# Patient Record
Sex: Female | Born: 1980 | Race: White | Hispanic: No | Marital: Single | Smoking: Never smoker
Health system: Southern US, Community
[De-identification: ages and names within clinical notes are randomized; demographics above are authoritative.]

## PROBLEM LIST (undated history)

## (undated) DIAGNOSIS — G911 Obstructive hydrocephalus: Secondary | ICD-10-CM

## (undated) DIAGNOSIS — R42 Dizziness and giddiness: Secondary | ICD-10-CM

## (undated) DIAGNOSIS — R000 Tachycardia, unspecified: Secondary | ICD-10-CM

## (undated) DIAGNOSIS — N200 Calculus of kidney: Secondary | ICD-10-CM

## (undated) DIAGNOSIS — R202 Paresthesia of skin: Secondary | ICD-10-CM

## (undated) DIAGNOSIS — F32A Depression, unspecified: Secondary | ICD-10-CM

## (undated) DIAGNOSIS — F3289 Other specified depressive episodes: Secondary | ICD-10-CM

## (undated) DIAGNOSIS — F3181 Bipolar II disorder: Secondary | ICD-10-CM

## (undated) DIAGNOSIS — F329 Major depressive disorder, single episode, unspecified: Secondary | ICD-10-CM

## (undated) HISTORY — DX: Major depressive disorder, single episode, unspecified: F32.9

## (undated) HISTORY — DX: Calculus of kidney: N20.0

## (undated) HISTORY — DX: Other specified depressive episodes: F32.89

## (undated) HISTORY — DX: Paresthesia of skin: R20.2

## (undated) HISTORY — DX: Dizziness and giddiness: R42

## (undated) HISTORY — DX: Bipolar II disorder: F31.81

## (undated) HISTORY — DX: Tachycardia, unspecified: R00.0

## (undated) HISTORY — DX: Obstructive hydrocephalus: G91.1

## (undated) HISTORY — DX: Depression, unspecified: F32.A

---
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
## 2004-07-29 HISTORY — PX: TONSILLECTOMY AND ADENOIDECTOMY: SHX28

## 2004-10-15 ENCOUNTER — Observation Stay (HOSPITAL_COMMUNITY): Admission: EM | Admit: 2004-10-15 | Discharge: 2004-10-16 | Payer: Self-pay | Admitting: Emergency Medicine

## 2004-11-16 ENCOUNTER — Other Ambulatory Visit: Admission: RE | Admit: 2004-11-16 | Discharge: 2004-11-16 | Payer: Self-pay | Admitting: Obstetrics and Gynecology

## 2005-05-22 ENCOUNTER — Ambulatory Visit: Payer: Self-pay | Admitting: Internal Medicine

## 2005-07-15 ENCOUNTER — Ambulatory Visit: Payer: Self-pay | Admitting: Internal Medicine

## 2005-08-23 ENCOUNTER — Ambulatory Visit: Payer: Self-pay | Admitting: Internal Medicine

## 2005-09-03 ENCOUNTER — Ambulatory Visit: Payer: Self-pay | Admitting: Internal Medicine

## 2005-09-10 ENCOUNTER — Ambulatory Visit: Payer: Self-pay | Admitting: Internal Medicine

## 2005-09-16 ENCOUNTER — Ambulatory Visit: Payer: Self-pay | Admitting: Internal Medicine

## 2005-09-20 ENCOUNTER — Ambulatory Visit: Payer: Self-pay | Admitting: Pulmonary Disease

## 2006-08-18 ENCOUNTER — Ambulatory Visit: Payer: Self-pay | Admitting: Internal Medicine

## 2006-08-18 LAB — CONVERTED CEMR LAB
Alkaline Phosphatase: 35 units/L — ABNORMAL LOW (ref 39–117)
Basophils Absolute: 0 10*3/uL (ref 0.0–0.1)
Basophils Relative: 0.2 % (ref 0.0–1.0)
Bilirubin, Direct: 0.1 mg/dL (ref 0.0–0.3)
Eosinophils Relative: 0.7 % (ref 0.0–5.0)
HCT: 42.2 % (ref 36.0–46.0)
Lymphocytes Relative: 30.6 % (ref 12.0–46.0)
MCV: 90.7 fL (ref 78.0–100.0)
Monocytes Absolute: 0.3 10*3/uL (ref 0.2–0.7)
Neutrophils Relative %: 60.9 % (ref 43.0–77.0)
RDW: 12.3 % (ref 11.5–14.6)
Total Protein: 6.7 g/dL (ref 6.0–8.3)
WBC: 4.3 10*3/uL — ABNORMAL LOW (ref 4.5–10.5)

## 2006-09-09 ENCOUNTER — Ambulatory Visit: Payer: Self-pay | Admitting: Internal Medicine

## 2006-09-17 ENCOUNTER — Ambulatory Visit: Payer: Self-pay | Admitting: Internal Medicine

## 2006-10-07 ENCOUNTER — Ambulatory Visit: Payer: Self-pay | Admitting: Internal Medicine

## 2006-10-30 ENCOUNTER — Ambulatory Visit: Payer: Self-pay | Admitting: Internal Medicine

## 2006-10-30 ENCOUNTER — Encounter: Payer: Self-pay | Admitting: Internal Medicine

## 2006-11-17 DIAGNOSIS — F3130 Bipolar disorder, current episode depressed, mild or moderate severity, unspecified: Secondary | ICD-10-CM | POA: Insufficient documentation

## 2007-06-01 ENCOUNTER — Ambulatory Visit: Payer: Self-pay | Admitting: Internal Medicine

## 2007-06-01 ENCOUNTER — Telehealth (INDEPENDENT_AMBULATORY_CARE_PROVIDER_SITE_OTHER): Payer: Self-pay | Admitting: *Deleted

## 2007-09-23 ENCOUNTER — Telehealth (INDEPENDENT_AMBULATORY_CARE_PROVIDER_SITE_OTHER): Payer: Self-pay | Admitting: *Deleted

## 2007-09-23 ENCOUNTER — Ambulatory Visit: Payer: Self-pay | Admitting: Internal Medicine

## 2007-09-23 LAB — CONVERTED CEMR LAB
Bilirubin Urine: NEGATIVE
Ketones, urine, test strip: NEGATIVE
Nitrite: NEGATIVE

## 2007-09-24 ENCOUNTER — Encounter: Payer: Self-pay | Admitting: Internal Medicine

## 2007-09-24 LAB — CONVERTED CEMR LAB: RBC / HPF: NONE SEEN (ref <3)

## 2007-09-29 ENCOUNTER — Telehealth (INDEPENDENT_AMBULATORY_CARE_PROVIDER_SITE_OTHER): Payer: Self-pay | Admitting: *Deleted

## 2007-10-02 ENCOUNTER — Emergency Department (HOSPITAL_COMMUNITY): Admission: EM | Admit: 2007-10-02 | Discharge: 2007-10-02 | Payer: Self-pay | Admitting: Emergency Medicine

## 2007-10-02 ENCOUNTER — Telehealth (INDEPENDENT_AMBULATORY_CARE_PROVIDER_SITE_OTHER): Payer: Self-pay | Admitting: *Deleted

## 2007-10-13 ENCOUNTER — Telehealth (INDEPENDENT_AMBULATORY_CARE_PROVIDER_SITE_OTHER): Payer: Self-pay | Admitting: *Deleted

## 2007-10-13 ENCOUNTER — Ambulatory Visit: Payer: Self-pay | Admitting: Internal Medicine

## 2007-10-13 DIAGNOSIS — N209 Urinary calculus, unspecified: Secondary | ICD-10-CM | POA: Insufficient documentation

## 2007-10-13 LAB — CONVERTED CEMR LAB
Bilirubin Urine: NEGATIVE
Ketones, urine, test strip: NEGATIVE
Specific Gravity, Urine: 1.01
Urobilinogen, UA: NEGATIVE
pH: 6

## 2007-10-14 ENCOUNTER — Encounter: Payer: Self-pay | Admitting: Internal Medicine

## 2007-10-16 ENCOUNTER — Telehealth: Payer: Self-pay | Admitting: Internal Medicine

## 2007-10-16 ENCOUNTER — Ambulatory Visit: Payer: Self-pay | Admitting: Cardiovascular Disease

## 2007-10-16 LAB — CONVERTED CEMR LAB

## 2009-03-16 ENCOUNTER — Encounter: Payer: Self-pay | Admitting: Internal Medicine

## 2009-03-24 ENCOUNTER — Telehealth (INDEPENDENT_AMBULATORY_CARE_PROVIDER_SITE_OTHER): Payer: Self-pay | Admitting: *Deleted

## 2009-04-17 ENCOUNTER — Ambulatory Visit: Payer: Self-pay | Admitting: Internal Medicine

## 2009-04-18 ENCOUNTER — Ambulatory Visit: Payer: Self-pay | Admitting: Internal Medicine

## 2009-04-18 LAB — CONVERTED CEMR LAB
Bilirubin Urine: NEGATIVE
Blood in Urine, dipstick: NEGATIVE
Glucose, Urine, Semiquant: NEGATIVE
Nitrite: NEGATIVE
Protein, U semiquant: NEGATIVE
Specific Gravity, Urine: 1.025
Urobilinogen, UA: 0.2
WBC Urine, dipstick: NEGATIVE
pH: 6

## 2009-04-24 ENCOUNTER — Ambulatory Visit: Payer: Self-pay | Admitting: Internal Medicine

## 2009-04-24 LAB — CONVERTED CEMR LAB
ALT: 11 units/L (ref 0–35)
AST: 13 units/L (ref 0–37)
Basophils Absolute: 0 10*3/uL (ref 0.0–0.1)
Basophils Relative: 0.1 % (ref 0.0–3.0)
Calcium: 9.1 mg/dL (ref 8.4–10.5)
Chloride: 105 meq/L (ref 96–112)
Cholesterol: 123 mg/dL (ref 0–200)
Creatinine, Ser: 0.9 mg/dL (ref 0.4–1.2)
GFR calc non Af Amer: 78.92 mL/min (ref >60)
Glucose, Bld: 85 mg/dL (ref 70–99)
Hemoglobin: 14.9 g/dL (ref 12.0–15.0)
Monocytes Absolute: 0.5 10*3/uL (ref 0.1–1.0)
RDW: 12.5 % (ref 11.5–14.6)
TSH: 2.51 microintl units/mL (ref 0.35–5.50)
Vitamin B-12: 240 pg/mL (ref 211–911)

## 2009-10-04 ENCOUNTER — Encounter: Payer: Self-pay | Admitting: Internal Medicine

## 2009-10-30 ENCOUNTER — Ambulatory Visit: Payer: Self-pay | Admitting: Internal Medicine

## 2009-10-30 DIAGNOSIS — E559 Vitamin D deficiency, unspecified: Secondary | ICD-10-CM | POA: Insufficient documentation

## 2009-10-30 DIAGNOSIS — D696 Thrombocytopenia, unspecified: Secondary | ICD-10-CM | POA: Insufficient documentation

## 2009-10-30 DIAGNOSIS — I10 Essential (primary) hypertension: Secondary | ICD-10-CM | POA: Insufficient documentation

## 2009-10-30 LAB — CONVERTED CEMR LAB: Vit D, 25-Hydroxy: 21 ng/mL — ABNORMAL LOW (ref 30–89)

## 2009-11-02 LAB — CONVERTED CEMR LAB
Basophils Absolute: 0 10*3/uL (ref 0.0–0.1)
Basophils Relative: 0.1 % (ref 0.0–3.0)
HCT: 41.7 % (ref 36.0–46.0)
Hemoglobin: 14.1 g/dL (ref 12.0–15.0)
Lymphocytes Relative: 18.5 % (ref 12.0–46.0)
Lymphs Abs: 1.5 10*3/uL (ref 0.7–4.0)
MCV: 92.9 fL (ref 78.0–100.0)
Monocytes Absolute: 0 10*3/uL — ABNORMAL LOW (ref 0.1–1.0)
RBC: 4.49 M/uL (ref 3.87–5.11)
RDW: 12.6 % (ref 11.5–14.6)
WBC: 7.9 10*3/uL (ref 4.5–10.5)

## 2010-03-14 ENCOUNTER — Ambulatory Visit: Payer: Self-pay | Admitting: Internal Medicine

## 2010-03-14 DIAGNOSIS — R42 Dizziness and giddiness: Secondary | ICD-10-CM | POA: Insufficient documentation

## 2010-03-16 ENCOUNTER — Telehealth: Payer: Self-pay | Admitting: Internal Medicine

## 2010-03-16 LAB — CONVERTED CEMR LAB
BUN: 12 mg/dL (ref 6–23)
Chloride: 106 meq/L (ref 96–112)
Creatinine, Ser: 0.8 mg/dL (ref 0.4–1.2)
Eosinophils Absolute: 0 10*3/uL (ref 0.0–0.7)
GFR calc non Af Amer: 93.89 mL/min (ref >60)
HCT: 43.2 % (ref 36.0–46.0)
Lymphocytes Relative: 25.9 % (ref 12.0–46.0)
Lymphs Abs: 1.9 10*3/uL (ref 0.7–4.0)
MCHC: 34.2 g/dL (ref 30.0–36.0)
MCV: 92.7 fL (ref 78.0–100.0)
Neutrophils Relative %: 65.9 % (ref 43.0–77.0)
Platelets: 147 10*3/uL — ABNORMAL LOW (ref 150.0–400.0)
Potassium: 4.3 meq/L (ref 3.5–5.1)
RBC: 4.66 M/uL (ref 3.87–5.11)
RDW: 13.6 % (ref 11.5–14.6)
Sodium: 144 meq/L (ref 135–145)
Valproic Acid Lvl: 95.2 ug/mL (ref 50.0–100.0)

## 2010-03-22 ENCOUNTER — Encounter: Admission: RE | Admit: 2010-03-22 | Discharge: 2010-03-22 | Payer: Self-pay | Admitting: Internal Medicine

## 2010-04-06 ENCOUNTER — Telehealth: Payer: Self-pay | Admitting: Internal Medicine

## 2010-08-28 NOTE — Assessment & Plan Note (Addendum)
Summary: bp check, cbc dx thrombocytopenia..vit d dx vtd d deficiency/...  Nurse Visit   Vital Signs:  Patient profile:   30 year old female BP sitting:   110 / 90  Vitals Entered By: Kandice Hams (October 30, 2009 3:53 PM)  Impression & Recommendations:  Problem # 1:  HEALTH SCREENING (ICD-V70.0) BP ok today  Complete Medication List: 1)  Depakote 250 Mg Tbec (Divalproex sodium) .... 3 by mouth qd 2)  Wellbutrin Xl 150 Mg Xr24h-tab (Bupropion hcl) .Marland Kitchen.. 1 by mouth once daily 3)  Abilify 2 Mg Tabs (Aripiprazole) .... Once daily 4)  Lorazepam 0.5 Mg Tabs (Lorazepam) .... As needed 5)  Ergocalciferol 50000 Unit Caps (Ergocalciferol) .Marland Kitchen.. 1 by mouth q week x 3 months  Other Orders: TLB-CBC Platelet - w/Differential (85025-CBCD) T-Vitamin D (25-Hydroxy) (40981-19147) Venipuncture (82956)  CC: bp check   Allergies: No Known Drug Allergies  Orders Added: 1)  Est. Patient Level I [21308] 2)  TLB-CBC Platelet - w/Differential [85025-CBCD] 3)  T-Vitamin D (25-Hydroxy) [65784-69629] 4)  Venipuncture [52841]

## 2010-08-28 NOTE — Assessment & Plan Note (Addendum)
Summary: vertigo/kn   Vital Signs:  Patient profile:   30 year old female Height:      66.5 inches Weight:      153.13 pounds BMI:     24.43 Pulse rate:   100 / minute Pulse rhythm:   regular BP sitting:   107 / 72  (left arm) Cuff size:   regular  Vitals Entered By: Army Fossa CMA (March 14, 2010 11:29 AM) CC: Pt states she feels she has "vertigo" Comments -Dizzines that comes and goes. - Nauseas - Has been going on since March. Doesnt happen daily   History of Present Illness: chief complaint is vertigo She is having episodes of vertigo-dizziness since  March 2011 She   was evaluated at the urgent care at that time, workup was negative (note reviewed, she was not orthostatic, had a chest x-ray, EKG. She was a slightly tachycardic) "Spells" of dizziness persists, they may  came once a day to  up to 6 times a day. Some days she feels fine They last from a few minutes to 20 minutes They are not triggered by standing or moving her head. Most of the time they start  while she's resting . symptoms described as spinning or "rocking around" Very rarely they are associated with nausea Sx do  not correlate with her stress level  ROS Denies slurred speech or motor deficits Some sinus pressure When asked, she admits to headaches, they are on and off, for few weeks, located at the posterior left side of the head These headaches are different from her previous headaches, they are not the worst of her life. She's not taken any new medicines, she used to take Abilify but that was discontinued more than a year ago She is on Depakote, a level was checked a few weeks ago, apparently her psychiatrist never received the results.   Current Medications (verified): 1)  Depakote 250 Mg  Tbec (Divalproex Sodium) .... 3 By Mouth Qd 2)  Wellbutrin Xl 150 Mg Xr24h-Tab (Bupropion Hcl) .Marland Kitchen.. 1 By Mouth Once Daily 3)  Lorazepam 0.5 Mg Tabs (Lorazepam) .... As Needed 4)  Vitamin D3 2000 Unit Caps  (Cholecalciferol) .... Qd  Allergies (verified): No Known Drug Allergies  Past History:  Past Medical History: Reviewed history from 04/24/2009 and no changes required. G0P0 Depression, sees Psych (Dr Kizzie Bane) kidney stone (2009)  Past Surgical History: Reviewed history from 06/01/2007 and no changes required. Appendectomy, 2006 Tonsillectomy, 2006 jaw surgery, 1999  Social History: Reviewed history from 04/24/2009 and no changes required. Single G0P0 Occupation: unemployed -- Consulting civil engineer Never Smoked Alcohol use-no Drug use-no Regular exercise--no  Physical Exam  General:  alert, well-developed, and well-nourished.   Neck:  no masses and normal carotid upstroke.   Lungs:  normal respiratory effort, no intercostal retractions, no accessory muscle use, and normal breath sounds.   Heart:  normal rate, regular rhythm, no murmur, and no gallop.   Extremities:  extremity edema Neurologic:  alert & oriented X3, cranial nerves II-XII intact, strength normal in all extremities, gait normal, and DTRs symmetrical and normal.  Romberg absent Psych:  Oriented X3, memory intact for recent and remote, normally interactive, good eye contact, not anxious appearing, and not depressed appearing.     Impression & Recommendations:  Problem # 1:  DIZZINESS (ICD-780.4)  dizziness of unclear etiology Review of systems is positive for headaches that are different from previous headaches. Physical exam is negative, pulse is noted to be 100. She was slightly tachycardic when  she was evaluated at an urgent care a few months ago Plan: Labs including a Depakote level MRI  ( discussed with radiology, needs MRI with and without, and a MRA) If workup negative and symptoms persist, will need ENT referral  Orders: Venipuncture (16109) TLB-BMP (Basic Metabolic Panel-BMET) (80048-METABOL) TLB-CBC Platelet - w/Differential (85025-CBCD) TLB-TSH (Thyroid Stimulating Hormone) (84443-TSH) TLB-B12 + Folate  Pnl (60454_09811-B14/NWG) Radiology Referral (Radiology)  Complete Medication List: 1)  Depakote 250 Mg Tbec (Divalproex sodium) .... 3 by mouth qd 2)  Wellbutrin Xl 150 Mg Xr24h-tab (Bupropion hcl) .Marland Kitchen.. 1 by mouth once daily 3)  Lorazepam 0.5 Mg Tabs (Lorazepam) .... As needed 4)  Vitamin D3 2000 Unit Caps (Cholecalciferol) .... Qd  Patient Instructions: 1)  Please schedule a follow-up appointment in 2 months.

## 2010-08-28 NOTE — Progress Notes (Addendum)
Summary: MRI results  Phone Note Outgoing Call   Summary of Call: MRI discuss with neurology, she has a large hydrocephalus, likely chronic. This may be due to a peri-natal asphyxia, meningitis etc. Symptoms associated with this  conditions are headache, gait disorders, memory loss or bladder-bowel problems Dizziness is not necessarily related to this finding. Plan: Will call Carollee Monday , if she has any of the above symptoms I will refer her to Dr. Marissa Nestle E. Paz MD  April 06, 2010 5:26 PM   Follow-up for Phone Call        spoke with the patient in Overall feels much better, this episodes are less frequent and much less intense. the patient reports that she was a premature baby, she was diagnosed early in life with hydrocephalus. Recommend to call me if she develops headaches, memory loss etc. We'll also call if the dizziness is not completely gone in  a few weeks Follow-up by: Elita Quick E. Paz MD,  April 09, 2010 4:23 PM

## 2010-08-28 NOTE — Progress Notes (Addendum)
Summary: PEER TO PEER REQUIRED FOR MRA  Phone Note Other Incoming Call back at [phone], OPT. 2   Caller: [employer] Summary of Call: IN REFERENCE TO REFERRAL FOR MRI BRAIN W/ & W/O CONTRAST & MRA HEAD, IT WAS NOT APPROVED BY PHONE.  IT WAS FORWARDED TO PHYSICIAN REVIEW, I THEN FAXED ALL YOUR NOTES/CLINICALS TO PHYSICIAN REVIEW, AND THEY ARE REQUESTING TO SPEAK WITH YOU ABOUT APPROVING THE "MRA".  PLEASE CALL [phone], PRESS OPTION 2, GIVE PATIENT'S NAME, DOB, AND HER [employer] MEMBER ID IS [id number].  [employer] STATES THE DEADLINE TO MAKE THIS CALL IS TUESDAY, 03-20-2010.  They recalled again remind [place] of this information and the deadline is 4pm..Marland KitchenHarold Barban  March 20, 2010 12:49 PM Initial call taken by: Magdalen Spatz [facility],  March 16, 2010 3:33 PM  Follow-up for Phone Call        MRI with and without,  MRA approved Number is: [id number] valid until  September 17 please reschedule Jose E. Paz MD  March 20, 2010 2:15 PM   Additional Follow-up for Phone Call Additional follow up Details #1::        PT SCHEDULED FOR MRI & MRA. Additional Follow-up by: Magdalen Spatz [facility],  March 21, 2010 9:21 AM

## 2010-08-28 NOTE — Letter (Addendum)
Summary: [facility]  [facility]   Imported By: Lanelle Bal 11/03/2009 13:04:09  _____________________________________________________________________  External Attachment:    Type:   Image     Comment:   External Document

## 2010-09-27 ENCOUNTER — Encounter (INDEPENDENT_AMBULATORY_CARE_PROVIDER_SITE_OTHER): Payer: BC Managed Care – PPO | Admitting: Internal Medicine

## 2010-09-27 ENCOUNTER — Other Ambulatory Visit: Payer: Self-pay | Admitting: Internal Medicine

## 2010-09-27 ENCOUNTER — Encounter: Payer: Self-pay | Admitting: Internal Medicine

## 2010-09-27 DIAGNOSIS — G911 Obstructive hydrocephalus: Secondary | ICD-10-CM | POA: Insufficient documentation

## 2010-09-27 DIAGNOSIS — R634 Abnormal weight loss: Secondary | ICD-10-CM

## 2010-09-27 DIAGNOSIS — Z0000 Encounter for general adult medical examination without abnormal findings: Secondary | ICD-10-CM

## 2010-09-27 LAB — HEPATIC FUNCTION PANEL
ALT: 13 U/L (ref 0–35)
AST: 14 U/L (ref 0–37)
Alkaline Phosphatase: 35 U/L — ABNORMAL LOW (ref 39–117)
Bilirubin, Direct: 0.1 mg/dL (ref 0.0–0.3)
Total Bilirubin: 0.6 mg/dL (ref 0.3–1.2)

## 2010-09-27 LAB — CBC WITH DIFFERENTIAL/PLATELET
Eosinophils Absolute: 0 10*3/uL (ref 0.0–0.7)
HCT: 42.5 % (ref 36.0–46.0)
Lymphocytes Relative: 28.3 % (ref 12.0–46.0)
Lymphs Abs: 1.9 10*3/uL (ref 0.7–4.0)
MCHC: 34.3 g/dL (ref 30.0–36.0)
MCV: 93.8 fl (ref 78.0–100.0)
Monocytes Absolute: 0.6 10*3/uL (ref 0.1–1.0)
Monocytes Relative: 8.6 % (ref 3.0–12.0)
Neutro Abs: 4.1 10*3/uL (ref 1.4–7.7)
Platelets: 141 10*3/uL — ABNORMAL LOW (ref 150.0–400.0)

## 2010-09-27 LAB — TSH: TSH: 3.63 u[IU]/mL (ref 0.35–5.50)

## 2010-09-27 LAB — LIPID PANEL: Total CHOL/HDL Ratio: 2

## 2010-10-04 NOTE — Assessment & Plan Note (Signed)
Summary: cpx/cbs   Vital Signs:  Patient profile:   30 year old female Height:      67.5 inches Weight:      144 pounds BMI:     22.30 Pulse rate:   72 / minute Pulse rhythm:   regular BP sitting:   110 / 70  (left arm) Cuff size:   regular  Vitals Entered By: Army Fossa CMA (September 27, 2010 8:08 AM) CC: CPX, fasting  Comments c/o hands really shaky still having some dizziness and HA on (L) side of head  [facility] [address]  will make appt w/ Gyn   History of Present Illness: PATIENT HAS 2 CHARTS CPX   - 3 months ago was seen with headache and dizziness, MRI showed hydrocephalus, symptoms are a lot better but is still from time to time as mild headaches, mostly at the L nuchal area - unintended weight loss , at least 10 pounds for the last few months - her depression is not  completely well controlled , a few weeks ago her psychiatrist increased Wellbutrin from 300 milligrams to 450 mg, she took it for 3 days and had  to stop due to to shakiness , anxiety; ever since then, she has been left with some tremors  ROS   no fevers  No excessive thirst or urination, her appetite is actually decreased Depression is not well controlled, not suicidal  denies chest pain, shortness of breath or lower extremity edema no  nausea, no vomiting or diarrhea No blood in the stools Periods are normal, not on BCPs. Has not seen gynecology  in a while  Preventive Screening-Counseling & Management  Caffeine-Diet-Exercise     Does Patient Exercise: no  Current Medications (verified): 1)  Depakote 500 Mg Tbec (Divalproex Sodium) .Marland Kitchen.. 1 1/2 Daily 2)  Wellbutrin Xl 300 Mg Xr24h-Tab (Bupropion Hcl) .... Qd 3)  Vitamin D3 2000 Unit Caps (Cholecalciferol) 4)  Mvi 5)  Fish Oil 6)  Lorazepam 0.5 Mg Tabs (Lorazepam) .... As Needed  Allergies (verified): No Known Drug Allergies  Past History:  Past Medical History: Depression, ?bipolar sees Psych (Dr Kizzie Bane) kidney stone (2009)  hydrocephalus, confirmed by MRI  03-2010. 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  NAD Neck:  no masses and no thyromegaly.   Lungs:  normal respiratory effort, no intercostal retractions, no accessory muscle use, and normal breath sounds.   Heart:  normal rate, regular rhythm, no murmur, and no gallop.   Abdomen:  soft, non-tender, no distention, no masses, no guarding, and no rigidity.   Extremities:  no pretibial edema bilaterally  Neurologic:  alert & oriented X3 and gait normal.   mild tremor noted Psych:  Oriented X3, memory intact for recent and remote, normally interactive, good eye contact, not anxious appearing, and not depressed appearing.     Impression & Recommendations:  Problem # 1:  ROUTINE GENERAL MEDICAL EXAM@HEALTH  CARE FACL (ICD-V70.0)  Td 2004  encouraged healthy diet and exercise  02-2010 BMP, CBC, TSH  and B12 were normal Labs including a vitamin D which was low before  needs to see gynecology  Orders: Venipuncture (16109) TLB-CBC Platelet - w/Differential (85025-CBCD) TLB-Lipid Panel (80061-LIPID) T-Vitamin D (25-Hydroxy) (60454-09811) TLB-Hepatic/Liver Function Pnl (80076-HEPATIC) Specimen Handling (91478)  Problem # 2:  WEIGHT LOSS (ICD-783.21) she has noted weight loss and the some tremor.  symptoms started after she temporarily increase Wellbutrin, unclear if there is a relationship.  because the depression is not well controlled is possible that she's not eating and  losing weight. We need to check a TSH, she will be referred to neurology, seen next  Orders: TLB-TSH (Thyroid Stimulating Hormone) (84443-TSH) TLB-T4 (Thyrox), Free [phone]) TLB-T3, Free (Triiodothyronine) (84481-T3FREE) Specimen Handling (65784)  Problem # 3:  HYDROCEPHALUS (ICD-331.4)   history of hydrocephalus, now presents w/  tremors, dizziness, occasional headaches. Symptoms may or may not be related with hydrocephalus. Neurology referral for clarification  Orders: Neurology Referral (Neuro)  Complete Medication List: 1)  Depakote 500 Mg Tbec (Divalproex sodium) .Marland Kitchen.. 1 1/2 daily 2)  Wellbutrin Xl 300 Mg Xr24h-tab (Bupropion hcl) .... Qd 3)  Vitamin D3 2000 Unit Caps (Cholecalciferol) 4)  Mvi  5)  Fish Oil  6)  Lorazepam 0.5 Mg Tabs (Lorazepam) .... As needed  Patient Instructions: 1)  Please schedule a follow-up appointment in 6 months .    Orders Added: 1)  Venipuncture [36415] 2)  TLB-CBC Platelet - w/Differential [85025-CBCD] 3)  TLB-Lipid Panel [80061-LIPID] 4)  TLB-TSH (Thyroid Stimulating Hormone) [84443-TSH] 5)  TLB-T4 (Thyrox), Free [69629-BM8U] 6)  TLB-T3, Free (Triiodothyronine) [13244-W1UUVO] 7)  T-Vitamin D (25-Hydroxy) [53664-40347] 8)  TLB-Hepatic/Liver Function Pnl [80076-HEPATIC] 9)  Specimen Handling [99000] 10)  Est. Patient age 14-39 [phone] 18)  Neurology Referral [Neuro] 12)  Est. Patient Level III [63875]     Risk Factors:  Alcohol use:  no Exercise:  no

## 2011-04-22 LAB — URINE CULTURE

## 2011-04-22 LAB — POCT URINALYSIS DIP (DEVICE)
Ketones, ur: 40 — AB
Nitrite: NEGATIVE
Operator id: 208841
pH: 8.5 — ABNORMAL HIGH

## 2011-04-22 LAB — POCT PREGNANCY, URINE
Operator id: 208841
Preg Test, Ur: NEGATIVE

## 2011-08-13 ENCOUNTER — Ambulatory Visit (INDEPENDENT_AMBULATORY_CARE_PROVIDER_SITE_OTHER): Payer: BC Managed Care – PPO | Admitting: Internal Medicine

## 2011-08-13 ENCOUNTER — Telehealth: Payer: Self-pay | Admitting: *Deleted

## 2011-08-13 ENCOUNTER — Encounter: Payer: Self-pay | Admitting: Internal Medicine

## 2011-08-13 ENCOUNTER — Ambulatory Visit (INDEPENDENT_AMBULATORY_CARE_PROVIDER_SITE_OTHER)
Admission: RE | Admit: 2011-08-13 | Discharge: 2011-08-13 | Disposition: A | Discharge status: Home / Self Care | Payer: BC Managed Care – PPO | Source: Ambulatory Visit | Attending: Internal Medicine | Admitting: Internal Medicine

## 2011-08-13 VITALS — BP 118/82 | HR 120 | Temp 98.5°F | Resp 16 | Wt 129.5 lb

## 2011-08-13 DIAGNOSIS — E559 Vitamin D deficiency, unspecified: Secondary | ICD-10-CM

## 2011-08-13 DIAGNOSIS — R202 Paresthesia of skin: Secondary | ICD-10-CM | POA: Insufficient documentation

## 2011-08-13 DIAGNOSIS — R634 Abnormal weight loss: Secondary | ICD-10-CM

## 2011-08-13 DIAGNOSIS — R209 Unspecified disturbances of skin sensation: Secondary | ICD-10-CM

## 2011-08-13 LAB — TSH: TSH: 1.94 u[IU]/mL (ref 0.35–5.50)

## 2011-08-13 LAB — CBC WITH DIFFERENTIAL/PLATELET
Basophils Absolute: 0 10*3/uL (ref 0.0–0.1)
Basophils Relative: 0.3 % (ref 0.0–3.0)
Eosinophils Relative: 0.2 % (ref 0.0–5.0)
HCT: 44.8 % (ref 36.0–46.0)
Hemoglobin: 15.3 g/dL — ABNORMAL HIGH (ref 12.0–15.0)
Lymphocytes Relative: 26.8 % (ref 12.0–46.0)
MCHC: 34 g/dL (ref 30.0–36.0)
MCV: 93.6 fl (ref 78.0–100.0)
Monocytes Absolute: 0.4 10*3/uL (ref 0.1–1.0)
Monocytes Relative: 6.8 % (ref 3.0–12.0)
Neutro Abs: 4.1 10*3/uL (ref 1.4–7.7)
Neutrophils Relative %: 65.9 % (ref 43.0–77.0)
Platelets: 153 10*3/uL (ref 150.0–400.0)
RDW: 13.1 % (ref 11.5–14.6)
WBC: 6.2 10*3/uL (ref 4.5–10.5)

## 2011-08-13 LAB — VITAMIN B12: Vitamin B-12: 483 pg/mL (ref 211–911)

## 2011-08-13 LAB — FOLATE: Folate: 21.4 ng/mL (ref >5.9)

## 2011-08-13 LAB — IRON: Iron: 137 ug/dL (ref 42–145)

## 2011-08-13 LAB — FERRITIN: Ferritin: 41.3 ng/mL (ref 10.0–291.0)

## 2011-08-13 NOTE — Assessment & Plan Note (Signed)
Labs

## 2011-08-13 NOTE — Patient Instructions (Signed)
Will call you with the results soon Get the XR done

## 2011-08-13 NOTE — Progress Notes (Signed)
  Subjective:    Patient ID: April Harper, female    DOB: 06-23-1981, 31 y.o.   MRN: [id number]  HPI Acute visit: Experiencing paresthesias on all the finger tips and even sometimes in her toes for the last 2 weeks. It is steady,  worse some days than others  w/o apparent reason. Her extremities feel either hot or very cold, not changes in color like in Raynaud phenomenon. Typing or writing increase her fingertips paresthesias. Sometimes he has a " flushing feeling" on her feet Also has noted ongoing weight loss; anxiety and depression are well-controlled, appetite is slightly decreased.   Past Medical History: Depression, ?bipolar sees Psych (Dr Kizzie Bane) kidney stone (2009) hydrocephalus, confirmed by MRI  03-2010. patient was told she was a premature baby and diagnosed with hydrocephalus early in life  Past surgical history Jaw surgery 1998 Appendectomy 2006 tonsillectomy 2006  Family History: breast ca--no colon ca--no lung ca--gf COPD --GM DM - no HTN - no stroke - no M - living good health F - deceased - pneumonia age 77    Social History: Single, G0P0 Occupation:Social Financial controller, unemployed at present  Never Smoked Alcohol use-no Drug use-no Regular exercise--no  Does Patient Exercise:  no   Review of Systems Medication list reviewed, not taking any over-the-counter. No fever chills, no tremors, no headaches. No nausea, vomiting, diarrhea or blood in the stools. No rash, arthralgias, myalgias, synovitis. Periods are regular.    Objective:   Physical Exam  Constitutional: She is oriented to person, place, and time. She appears well-developed and well-nourished. No distress.  HENT:  Head: Normocephalic and atraumatic.  Eyes: EOM are normal. Pupils are equal, round, and reactive to light.  Neck: No thyromegaly present.  Cardiovascular: Normal rate, regular rhythm and normal heart sounds.   No murmur heard. Pulmonary/Chest: Effort normal and breath sounds  normal. No respiratory distress. She has no wheezes. She has no rales.  Abdominal: Soft. Bowel sounds are normal. She exhibits no distension. There is no tenderness. There is no rebound and no guarding.       No organomegaly  Musculoskeletal: She exhibits no edema.       Normal pedal pulses  Neurological: She is alert and oriented to person, place, and time.       Speech, gait, motor normal. DTRs symmetric. Pinprick examination of her extremities normal.   Skin: She is not diaphoretic.  Psychiatric: She has a normal mood and affect. Her behavior is normal. Judgment and thought content normal.      Assessment & Plan:

## 2011-08-13 NOTE — Assessment & Plan Note (Addendum)
Ongoing issue, chart reviewed: 02-2010  153 pounds, 09-2010 144 pounds, today 120 pounds No anxiety  or depression, in general feeling well, she's not exercising more than usual but recognize her appetite is a slightly slow. Her pulse today is 120. I again suspect thyroid disease but her TSH was normal a few months ago was normal. Labs, chest x-ray, further advice with results

## 2011-08-13 NOTE — Telephone Encounter (Signed)
Med update

## 2011-08-13 NOTE — Assessment & Plan Note (Signed)
Unclear etiology, neuro exam normal, no evidence of vascular involvement or Raynaud disease by history and physical. Plan: Labs, further advice w/ result

## 2011-08-14 LAB — VITAMIN D 25 HYDROXY (VIT D DEFICIENCY, FRACTURES): Vit D, 25-Hydroxy: 60 ng/mL (ref 30–89)

## 2011-08-14 LAB — HIV ANTIBODY (ROUTINE TESTING W REFLEX): HIV: NONREACTIVE

## 2011-08-19 ENCOUNTER — Telehealth: Payer: Self-pay | Admitting: *Deleted

## 2011-08-19 NOTE — Telephone Encounter (Signed)
Message copied by Regis Bill on Mon Aug 19, 2011  2:39 PM ------      Message from: Willow Ora E      Created: Mon Aug 19, 2011  1:55 PM       Advise patient:      All labs and x-rays are completely normal.      I researched her weight loss and tachycardia and Wellbutrin can cause both of them.      I recommend her to discuss this issue with psychiatry Dr. Kizzie Bane, if she could also give me a fax number, I will fax my last office visit notes and comments regards to Wellbutrin.      Ask patient to come back to the office in 2 months to monitor her weight

## 2011-08-19 NOTE — Telephone Encounter (Signed)
Patient informed. Dr Kizzie Bane fax: [phone] Last OV notes & labs printed; ready for fax after MD notation.

## 2011-08-27 ENCOUNTER — Encounter: Payer: Self-pay | Admitting: Internal Medicine

## 2011-11-16 ENCOUNTER — Telehealth: Payer: Self-pay | Admitting: Internal Medicine

## 2011-11-16 NOTE — Telephone Encounter (Signed)
Advise patient: Needs f/u schedule to monitor her weight

## 2011-11-19 NOTE — Telephone Encounter (Signed)
LMOVM this morning for pt to return call. See Stephanie's note below regarding pt's appointment.

## 2011-11-19 NOTE — Telephone Encounter (Signed)
Patient will call back when she gets her calendar to schedule follow up to monitor her weight

## 2011-11-19 NOTE — Telephone Encounter (Signed)
thx

## 2011-12-27 ENCOUNTER — Ambulatory Visit (INDEPENDENT_AMBULATORY_CARE_PROVIDER_SITE_OTHER): Payer: BC Managed Care – PPO | Admitting: Internal Medicine

## 2011-12-27 ENCOUNTER — Encounter: Payer: Self-pay | Admitting: Internal Medicine

## 2011-12-27 VITALS — BP 108/74 | HR 124 | Temp 98.3°F | Wt 128.6 lb

## 2011-12-27 DIAGNOSIS — R000 Tachycardia, unspecified: Secondary | ICD-10-CM

## 2011-12-27 DIAGNOSIS — R634 Abnormal weight loss: Secondary | ICD-10-CM

## 2011-12-27 DIAGNOSIS — J309 Allergic rhinitis, unspecified: Secondary | ICD-10-CM

## 2011-12-27 LAB — BASIC METABOLIC PANEL WITH GFR
BUN: 17 mg/dL (ref 6–23)
CO2: 28 meq/L (ref 19–32)
Calcium: 9.5 mg/dL (ref 8.4–10.5)
Chloride: 107 meq/L (ref 96–112)
Creatinine, Ser: 1 mg/dL (ref 0.4–1.2)
GFR: 71.93 mL/min
Glucose, Bld: 80 mg/dL (ref 70–99)
Potassium: 4.6 meq/L (ref 3.5–5.1)
Sodium: 144 meq/L (ref 135–145)

## 2011-12-27 MED ORDER — FLUTICASONE PROPIONATE 50 MCG/ACT NA SUSP: 2.0000 | Freq: Every day | NASAL | Status: DC

## 2011-12-27 NOTE — Assessment & Plan Note (Signed)
Discontinue Zyrtec-D, okay to take plain Zyrtec Flonase See instructions

## 2011-12-27 NOTE — Assessment & Plan Note (Addendum)
Patient was tachycardic when she arrived to the office, EKGs show a heart rate of 92,   QRS wider compared to previous EKG?. Plan: We'll discuss with cardiology----> addendum EKG stable, no worrisome features

## 2011-12-27 NOTE — Patient Instructions (Signed)
Change to plain Zyrtec or plain Claritin, avoid the  "D." Flonase until you feel better Mucinex DM as needed for cough Call if you're not well in 7-10 days -------- Please come back in 6 months for a routine checkup

## 2011-12-27 NOTE — Progress Notes (Signed)
  Subjective:    Patient ID: April Harper, female    DOB: 09/14/1980, 31 y.o.   MRN: [id number]  HPI Acute visit respiratory symptoms started about 2 weeks ago: having slightly tender and swollen lymph nodes in the sides of the neck, + nasal discharge. Discharge was initially clear, then thick and green and now clear again. She's taking Claritin-D or Zyrtec D  as needed.  Past Medical History: Depression, ?bipolar sees Psych (Dr Kizzie Bane) kidney stone (2009) hydrocephalus, confirmed by MRI  03-2010. patient was told she was a premature baby and diagnosed with hydrocephalus early in life  Past surgical history Jaw surgery 1998 Appendectomy 2006 tonsillectomy 2006  Review of Systems Was seen a few months ago with weight loss, she has plateau at around 128 pounds. No fever or chills No nausea, vomiting, diarrhea. No sore throat. Mild itchy eyes and sneezing which is not better (from taking antihistaminics?)    Objective:   Physical Exam  General -- alert, well-developed, and well-nourished.   Neck --no thyromegaly  HEENT -- TMs slightly bulged, no red, no discharge; throat w/o redness, face symmetric and not tender to palpation, Lungs -- normal respiratory effort, no intercostal retractions, no accessory muscle use, and normal breath sounds.   Heart-- tachycardic without a murmur Extremities-- no pretibial edema bilaterally Neurologic-- alert & oriented X3 and strength normal in all extremities. Psych-- Cognition and judgment appear intact. Alert and cooperative with normal attention span and concentration.  not anxious appearing and not depressed appearing.         Assessment & Plan:

## 2011-12-27 NOTE — Assessment & Plan Note (Signed)
Weight loss has plateau

## 2011-12-29 ENCOUNTER — Encounter: Payer: Self-pay | Admitting: Internal Medicine

## 2011-12-30 ENCOUNTER — Encounter: Payer: Self-pay | Admitting: *Deleted

## 2012-06-05 IMAGING — CR DG CHEST 2V
2 series · 2 of 2 positions shown · non-contrast
Comparison: None

CLINICAL DATA: Weight loss.

CHEST - 2 VIEW

[view not recorded (1 of 2)]
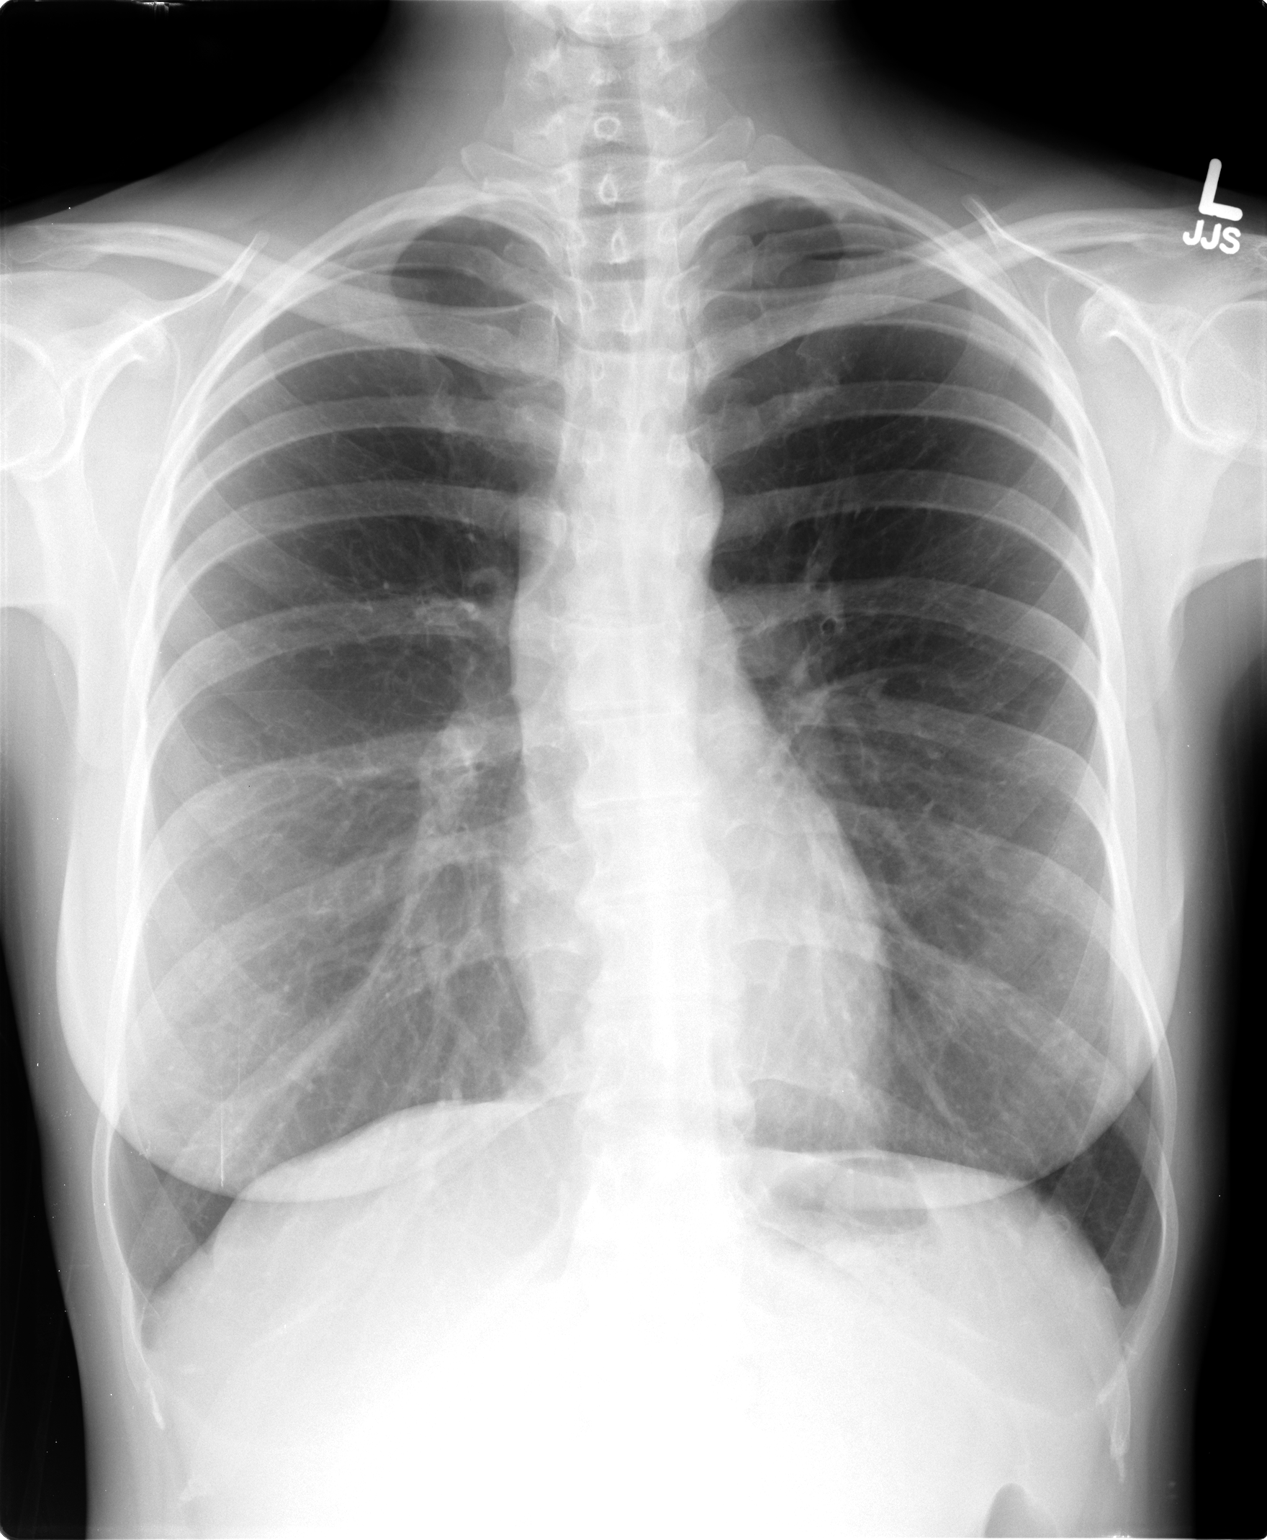

[view not recorded (2 of 2)]
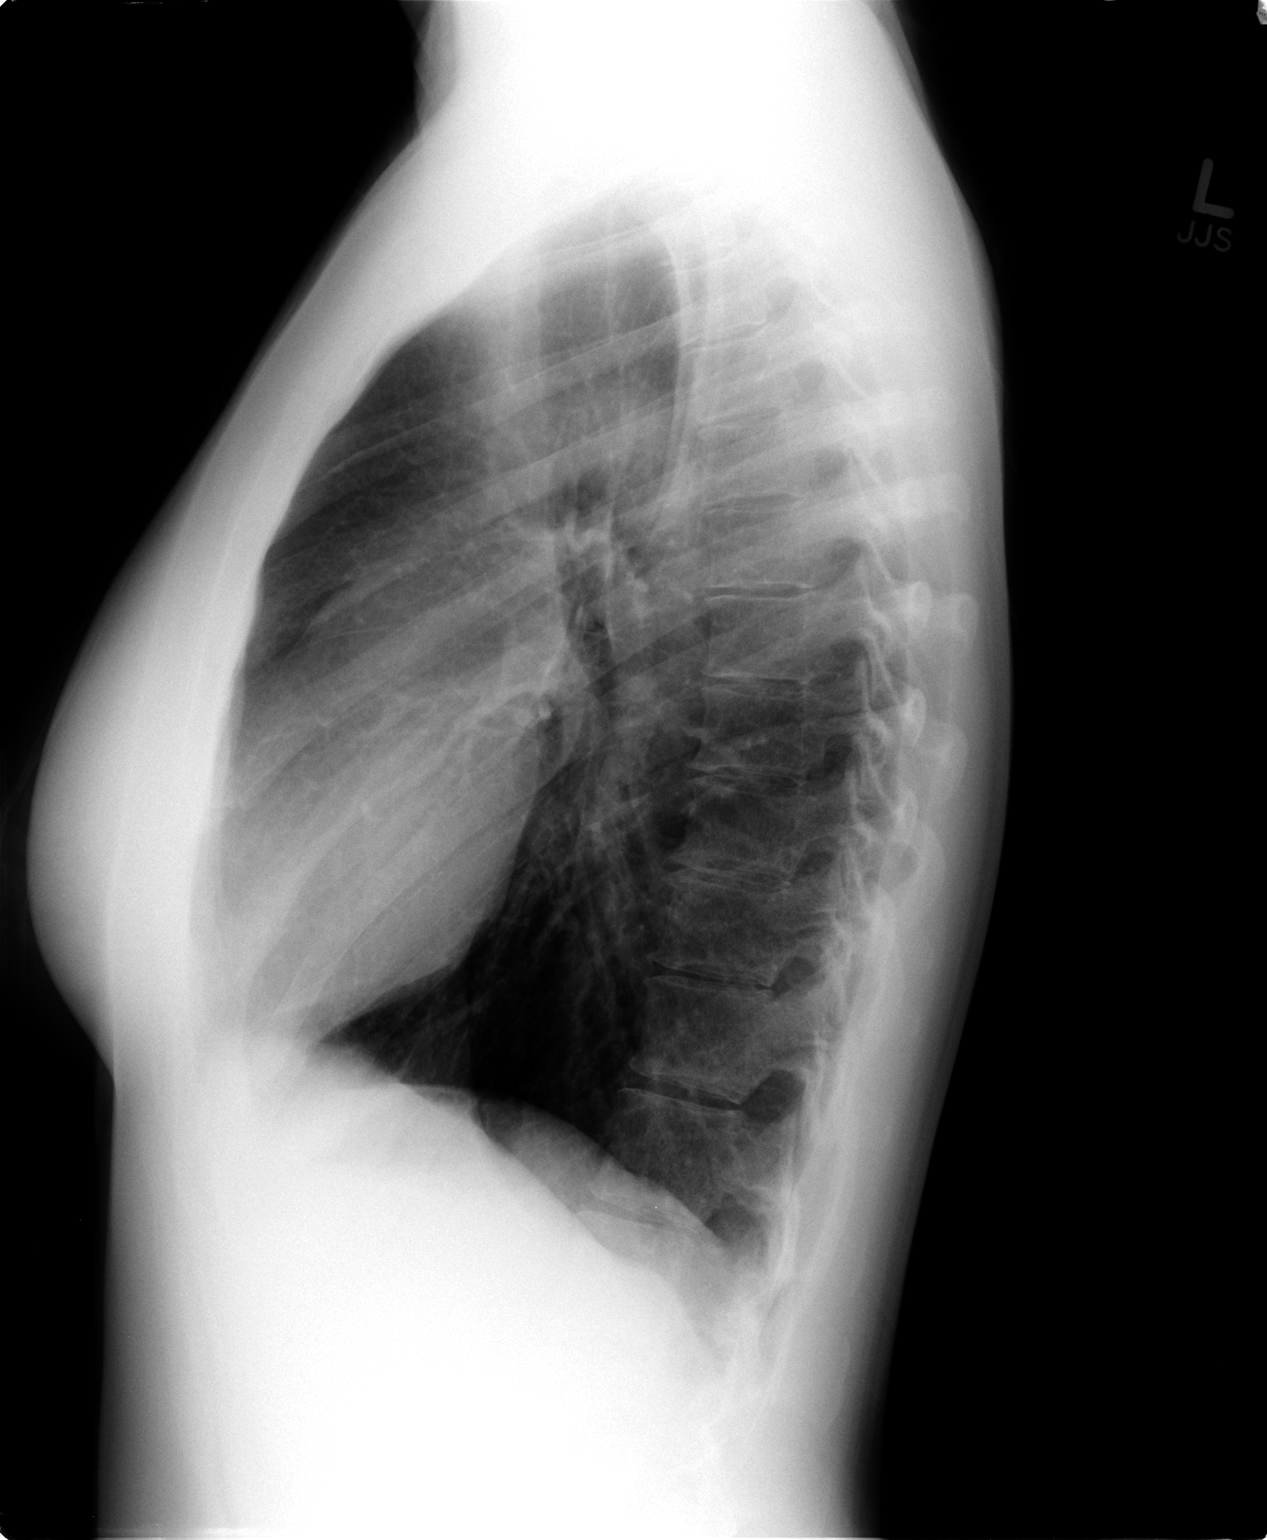

[2 of 2 positions shown; findings below may reference images not displayed]

FINDINGS: Heart and mediastinal contours are within normal limits.
No focal opacities or effusions.  No acute bony abnormality.
IMPRESSION: No active cardiopulmonary disease.

## 2012-06-17 ENCOUNTER — Encounter: Payer: Self-pay | Admitting: Internal Medicine

## 2012-06-17 ENCOUNTER — Ambulatory Visit (INDEPENDENT_AMBULATORY_CARE_PROVIDER_SITE_OTHER): Payer: BC Managed Care – PPO | Admitting: Internal Medicine

## 2012-06-17 VITALS — BP 118/84 | HR 111 | Temp 97.5°F | Wt 132.0 lb

## 2012-06-17 DIAGNOSIS — R209 Unspecified disturbances of skin sensation: Secondary | ICD-10-CM

## 2012-06-17 DIAGNOSIS — R000 Tachycardia, unspecified: Secondary | ICD-10-CM

## 2012-06-17 DIAGNOSIS — R202 Paresthesia of skin: Secondary | ICD-10-CM

## 2012-06-17 DIAGNOSIS — Z23 Encounter for immunization: Secondary | ICD-10-CM

## 2012-06-17 DIAGNOSIS — F3289 Other specified depressive episodes: Secondary | ICD-10-CM

## 2012-06-17 DIAGNOSIS — G911 Obstructive hydrocephalus: Secondary | ICD-10-CM

## 2012-06-17 DIAGNOSIS — F329 Major depressive disorder, single episode, unspecified: Secondary | ICD-10-CM

## 2012-06-17 DIAGNOSIS — R42 Dizziness and giddiness: Secondary | ICD-10-CM

## 2012-06-17 NOTE — Progress Notes (Signed)
  Subjective:    Patient ID: April Harper, female    DOB: 1981-04-24, 31 y.o.   MRN: [id number]  HPI Acute visit Was doing well up until 4 weeks ago when stress at work definitely increased. Has several complaints: --decreased appetite for 3 weeks --1 week history of dizziness, on and off, had 6-8 episodes. Symptoms are sometimes severe, may last few hours. Denies headaches, nausea, vomiting, diplopia. -- Ill-defined dull ache in the legs, from the hip to the knee. Occasional calf pain. Denies any rash in the lower extremities, no back pain, no lower extremity edema, no increase in physical activity. --One month history of lack of energy --On-off cramps @ toes mostly at night for the last month.  Past Medical History: Depression, ?bipolar sees Psych (Dr Kizzie Bane) kidney stone (2009) hydrocephalus, confirmed by MRI  03-2010. patient was told she was a premature baby and diagnosed with hydrocephalus early in life  Past surgical history Jaw surgery 1998 Appendectomy 2006 tonsillectomy 2006  Family History: breast ca--no colon ca--no lung ca--gf COPD --GM DM - no HTN - no stroke - no M - living good health F - deceased - pneumonia age 60    Social History: Single, G0P0 Ecologist, works @ [employer]   Never Smoked Alcohol use-no Drug use-no    Review of Systems Sleeps well although in the last few days has been sleeping less hours d/t stress. Sees psychiatry regularly for depression, symptoms are well-controlled. She does have some anxiety.     Objective:   Physical Exam General -- alert, well-developed, and well-nourished.   Neck --no thyromegaly  Lungs -- normal respiratory effort, no intercostal retractions, no accessory muscle use, and normal breath sounds.   Heart-- normal rate, regular rhythm, no murmur, and no gallop.   Abdomen--soft, non-tender, no distention, no masses, no HSM, no guarding, and no rigidity. No bruit  Extremities-- no pretibial  edema bilaterally; radial pulse on the right is  less noticeable than L,  not a new finding. Neurologic-- alert & oriented X3, speech gait and motor are intact. DTRs symmetric and not increased. Psych-- Cognition and judgment appear intact. Alert and cooperative with normal attention span and concentration.  not anxious appearing and not depressed appearing.      Assessment & Plan:   Fatigue--Chart is reviewed, within the last year all labs were normal. Reassess in 6 weeks  Today , I spent more than 25  min with the patient, >50% of the time counseling, and /or reviewing the chart

## 2012-06-17 NOTE — Assessment & Plan Note (Addendum)
On and off problem for a while. 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 There is white matter volume loss in  the occipital parietal white matter, especially on the left which  may be related to perinatal ischemia or hydrocephalous. No acute  abnormality.  She went to see neurology 10-2010, due to dizziness, there repeated the MRI, report not available, pt was told it was ok. Symptoms has resurface a week ago, related to stress?. Plan: Observation, if symptoms continue needs to see neurology. Patient will let me know

## 2012-06-17 NOTE — Assessment & Plan Note (Addendum)
Ill-defined ache in the thighs. Reassess in 6 weeks.

## 2012-06-17 NOTE — Assessment & Plan Note (Addendum)
Chronic tachycardia, she had other symptoms in the past, including weight loss and feeling flushed. Also she has a decreased right radial pulse. Plan:  Refer to cardiology, for eval of tachycardia (r/o  Pheochromocytoma?) And asymmetric radial pulse.

## 2012-06-17 NOTE — Assessment & Plan Note (Signed)
Depression we'll control, increase distress, some anxiety. Recommend to discuss with psychiatry.

## 2012-07-08 ENCOUNTER — Ambulatory Visit: Payer: BC Managed Care – PPO | Admitting: Cardiovascular Disease

## 2012-07-13 ENCOUNTER — Encounter: Payer: Self-pay | Admitting: *Deleted

## 2012-07-14 ENCOUNTER — Encounter: Payer: Self-pay | Admitting: Cardiovascular Disease

## 2012-07-14 ENCOUNTER — Ambulatory Visit (INDEPENDENT_AMBULATORY_CARE_PROVIDER_SITE_OTHER): Payer: BC Managed Care – PPO | Admitting: Cardiovascular Disease

## 2012-07-14 VITALS — BP 122/72 | HR 110 | Ht 67.5 in | Wt 130.0 lb

## 2012-07-14 DIAGNOSIS — F329 Major depressive disorder, single episode, unspecified: Secondary | ICD-10-CM

## 2012-07-14 DIAGNOSIS — R000 Tachycardia, unspecified: Secondary | ICD-10-CM

## 2012-07-14 DIAGNOSIS — R002 Palpitations: Secondary | ICD-10-CM

## 2012-07-14 DIAGNOSIS — R42 Dizziness and giddiness: Secondary | ICD-10-CM

## 2012-07-14 NOTE — Patient Instructions (Signed)
Your physician recommends that you schedule a follow-up appointment in: AS NEEDED  Your physician recommends that you continue on your current medications as directed. Please refer to the Current Medication list given to you today.  Your physician has recommended that you wear a holter monitor. Holter monitors are medical devices that record the heart's electrical activity. Doctors most often use these monitors to diagnose arrhythmias. Arrhythmias are problems with the speed or rhythm of the heartbeat. The monitor is a small, portable device. You can wear one while you do your normal daily activities. This is usually used to diagnose what is causing palpitations/syncope (passing out).  24 HOUR  DX 785.1  Your physician has requested that you have an echocardiogram. Echocardiography is a painless test that uses sound waves to create images of your heart. It provides your doctor with information about the size and shape of your heart and how well your heart's chambers and valves are working. This procedure takes approximately one hour. There are no restrictions for this procedure. DX  785.1

## 2012-07-14 NOTE — Assessment & Plan Note (Signed)
Seems functional Not postural in clinic with palpable BP sitting and standing 120  No POTS.  Not likely related to welbutrin.  Will do 24 hour holter to document lower HR at night and normal circadian variation Echo to R/O structural heart disease

## 2012-07-14 NOTE — Progress Notes (Signed)
Patient ID: April Harper, female   DOB: 1981/07/20, 31 y.o.   MRN: [id number] 31 yo referred by Dr Drue Novel for dizzyness and tachycardia.  Has had issues with this for over 2 years.  History of bipolar disease on depokote as mood stabilizer and welbutrin for depression.  Interestingly has identical twin sister who does not manifest this.  Changes happened when she returned from college.  Occasional palpitations no syncope.  Has been worse with stress but not always related.  No chest pain or dyspnea.  Symptoms not postural.  Occasional sensation that she is moving in the room and nausea.  Denies caffiene, stimulants. TSH always normal.  No other autoimmune symptoms.    ROS: Denies fever, malais, weight loss, blurry vision, decreased visual acuity, cough, sputum, SOB, hemoptysis, pleuritic pain, palpitaitons, heartburn, abdominal pain, melena, lower extremity edema, claudication, or rash.  All other systems reviewed and negative   General: Affect appropriate Healthy:  appears stated age HEENT: normal Neck supple with no adenopathy JVP normal no bruits no thyromegaly Lungs clear with no wheezing and good diaphragmatic motion Heart:  S1/S2 no murmur,rub, gallop or click PMI normal Abdomen: benighn, BS positve, no tenderness, no AAA no bruit.  No HSM or HJR Distal pulses intact with no bruits No edema Neuro non-focal Skin warm and dry No muscular weakness  Medications Current Outpatient Prescriptions  Medication Sig Dispense Refill  . buPROPion (WELLBUTRIN XL) 300 MG 24 hr tablet Take 300 mg by mouth daily.      . Cholecalciferol (VITAMIN D3) 2000 UNITS capsule Take 2,000 Units by mouth daily.      . divalproex (DEPAKOTE) 500 MG DR tablet Take 750 mg by mouth daily.      . fluticasone (FLONASE) 50 MCG/ACT nasal spray Place 2 sprays into the nose daily.  16 g  2  . LORazepam (ATIVAN) 0.5 MG tablet Take 0.5 mg by mouth as needed.      . Multiple Vitamin (MULTIVITAMIN) tablet Take 1 tablet by  mouth daily.        Allergies Review of patient's allergies indicates no known allergies.  Family History: Family History  Problem Relation Age of Onset  . Lung cancer      Grandfather  . COPD      Grandmother  . Pneumonia Father 60    Social History: History   Social History  . Marital Status: Married    Spouse Name: N/A    Number of Children: N/A  . Years of Education: N/A   Occupational History  . Not on file.   Social History Main Topics  . Smoking status: Never Smoker   . Smokeless tobacco: Never Used  . Alcohol Use: No  . Drug Use: No  . Sexually Active: Not on file   Other Topics Concern  . Not on file   Social History Narrative  . No narrative on file    Electrocardiogram:  12/27/11  SR rate 92 normal  Assessment and Plan

## 2012-07-14 NOTE — Assessment & Plan Note (Signed)
Seems more vestibular and not related to HR or tachycardia.  Consdier ENT F/U .

## 2012-07-14 NOTE — Assessment & Plan Note (Signed)
Continue welbutrin seems to do a good job

## 2012-07-24 ENCOUNTER — Ambulatory Visit (HOSPITAL_COMMUNITY): Payer: BC Managed Care – PPO | Attending: Cardiology | Admitting: Radiology

## 2012-07-24 DIAGNOSIS — I059 Rheumatic mitral valve disease, unspecified: Secondary | ICD-10-CM | POA: Insufficient documentation

## 2012-07-24 DIAGNOSIS — I379 Nonrheumatic pulmonary valve disorder, unspecified: Secondary | ICD-10-CM | POA: Insufficient documentation

## 2012-07-24 DIAGNOSIS — R000 Tachycardia, unspecified: Secondary | ICD-10-CM | POA: Insufficient documentation

## 2012-07-24 DIAGNOSIS — R002 Palpitations: Secondary | ICD-10-CM

## 2012-07-24 DIAGNOSIS — I369 Nonrheumatic tricuspid valve disorder, unspecified: Secondary | ICD-10-CM | POA: Insufficient documentation

## 2012-07-24 DIAGNOSIS — R42 Dizziness and giddiness: Secondary | ICD-10-CM

## 2012-07-24 NOTE — Progress Notes (Signed)
Echocardiogram performed.  

## 2012-07-27 ENCOUNTER — Encounter (INDEPENDENT_AMBULATORY_CARE_PROVIDER_SITE_OTHER): Payer: BC Managed Care – PPO

## 2012-07-27 ENCOUNTER — Telehealth: Payer: Self-pay | Admitting: *Deleted

## 2012-07-27 DIAGNOSIS — R002 Palpitations: Secondary | ICD-10-CM

## 2012-07-27 DIAGNOSIS — R000 Tachycardia, unspecified: Secondary | ICD-10-CM

## 2012-07-27 DIAGNOSIS — R42 Dizziness and giddiness: Secondary | ICD-10-CM

## 2012-07-27 NOTE — Telephone Encounter (Signed)
24 hr holter moniter placed on pt 07/27/12

## 2012-08-04 ENCOUNTER — Ambulatory Visit: Payer: BC Managed Care – PPO | Admitting: Internal Medicine

## 2012-08-10 ENCOUNTER — Ambulatory Visit: Payer: BC Managed Care – PPO | Admitting: Internal Medicine

## 2012-08-11 ENCOUNTER — Telehealth: Payer: Self-pay | Admitting: Cardiovascular Disease

## 2012-08-11 NOTE — Telephone Encounter (Signed)
Pt rtn call to christine °

## 2012-08-18 NOTE — Telephone Encounter (Signed)
AWAITING MONITOR RESULTS ./CY   PT AWARE OF ECHO RESULTS .Zack Seal

## 2012-08-19 NOTE — Telephone Encounter (Signed)
PT AWARE OF HOLTER RESULTS./CY 

## 2013-03-01 ENCOUNTER — Encounter: Payer: Self-pay | Admitting: Internal Medicine

## 2013-03-01 ENCOUNTER — Ambulatory Visit (INDEPENDENT_AMBULATORY_CARE_PROVIDER_SITE_OTHER): Payer: BC Managed Care – PPO | Admitting: Internal Medicine

## 2013-03-01 VITALS — BP 130/90 | HR 98 | Temp 98.2°F | Wt 141.2 lb

## 2013-03-01 DIAGNOSIS — R21 Rash and other nonspecific skin eruption: Secondary | ICD-10-CM

## 2013-03-01 MED ORDER — HYDROCORTISONE 2.5 % EX CREA: TOPICAL_CREAM | Freq: Two times a day (BID) | CUTANEOUS | Status: DC

## 2013-03-01 NOTE — Patient Instructions (Addendum)
use the cream twice a day for one week Call anytime if you have fever, chills, the rash gets bigger or you develop a ulcer.

## 2013-03-01 NOTE — Progress Notes (Signed)
  Subjective:    Patient ID: April Harper, female    DOB: [birth year], 32 y.o.   MRN: [id number]  HPI Acute visit 2 days ago developed a rash, she noted the first time when she woke up, "2 raised dots" at the  right foot, In the next few hours she developed additional macular redness around it. Spider bite? The area was itching,  Did not hurt. No blisters were visualized  Past Medical History: Depression, ?bipolar sees Psych (Dr Kizzie Bane) kidney stone (2009) hydrocephalus, confirmed by MRI  03-2010. patient was told she was a premature baby and diagnosed with hydrocephalus early in life  Past surgical history Jaw surgery 1998 Appendectomy 2006 tonsillectomy 2006  Family History: breast ca--no colon ca--no lung ca--gf COPD --GM DM - no HTN - no stroke - no M - living good health F - deceased - pneumonia age 58    Social History: Single, G0P0 Ecologist, works @ [employer]   Never Smoked Alcohol use-no Drug use-no  Review of Systems Has not been exposed to poison ivy that she recalls. Has not seen any bats at home. No fever or chills Slightly fatigue last week (PMS?) No artharlgias     Objective:   Physical Exam  Constitutional: She appears well-developed and well-nourished. No distress.  Musculoskeletal:       Feet:  Skin: She is not diaphoretic.          Assessment & Plan:  Rash, Unclear etiology, doubt insect bite or other serious etiology. Plan: Hydrocortisone, if not better let me know.

## 2013-09-10 ENCOUNTER — Ambulatory Visit (INDEPENDENT_AMBULATORY_CARE_PROVIDER_SITE_OTHER): Payer: BC Managed Care – PPO | Admitting: Family Medicine

## 2013-09-10 ENCOUNTER — Encounter: Payer: Self-pay | Admitting: Family Medicine

## 2013-09-10 ENCOUNTER — Telehealth: Payer: Self-pay | Admitting: Internal Medicine

## 2013-09-10 VITALS — BP 128/84 | HR 113 | Temp 98.5°F | Resp 16 | Wt 140.5 lb

## 2013-09-10 DIAGNOSIS — F3289 Other specified depressive episodes: Secondary | ICD-10-CM

## 2013-09-10 DIAGNOSIS — J309 Allergic rhinitis, unspecified: Secondary | ICD-10-CM

## 2013-09-10 DIAGNOSIS — R5383 Other fatigue: Secondary | ICD-10-CM | POA: Insufficient documentation

## 2013-09-10 DIAGNOSIS — R5381 Other malaise: Secondary | ICD-10-CM

## 2013-09-10 DIAGNOSIS — F329 Major depressive disorder, single episode, unspecified: Secondary | ICD-10-CM

## 2013-09-10 LAB — CBC WITH DIFFERENTIAL/PLATELET
BASOS ABS: 0 10*3/uL (ref 0.0–0.1)
Basophils Relative: 0.4 % (ref 0.0–3.0)
EOS PCT: 0.5 % (ref 0.0–5.0)
Eosinophils Absolute: 0 10*3/uL (ref 0.0–0.7)
HCT: 47.5 % — ABNORMAL HIGH (ref 36.0–46.0)
Hemoglobin: 15.4 g/dL — ABNORMAL HIGH (ref 12.0–15.0)
LYMPHS ABS: 1.8 10*3/uL (ref 0.7–4.0)
LYMPHS PCT: 30.6 % (ref 12.0–46.0)
MCHC: 32.3 g/dL (ref 30.0–36.0)
MCV: 93.1 fl (ref 78.0–100.0)
MONOS PCT: 9.2 % (ref 3.0–12.0)
Monocytes Absolute: 0.5 10*3/uL (ref 0.1–1.0)
NEUTROS PCT: 59.3 % (ref 43.0–77.0)
Neutro Abs: 3.4 10*3/uL (ref 1.4–7.7)
PLATELETS: 177 10*3/uL (ref 150.0–400.0)
RBC: 5.11 Mil/uL (ref 3.87–5.11)
RDW: 12.8 % (ref 11.5–14.6)
WBC: 5.7 10*3/uL (ref 4.5–10.5)

## 2013-09-10 LAB — BASIC METABOLIC PANEL
BUN: 17 mg/dL (ref 6–23)
CALCIUM: 9.5 mg/dL (ref 8.4–10.5)
CO2: 22 mEq/L (ref 19–32)
CREATININE: 1 mg/dL (ref 0.4–1.2)
Chloride: 104 mEq/L (ref 96–112)
GFR: 71.16 mL/min (ref >60.00)
Glucose, Bld: 72 mg/dL (ref 70–99)
Potassium: 3.4 mEq/L — ABNORMAL LOW (ref 3.5–5.1)
Sodium: 141 mEq/L (ref 135–145)

## 2013-09-10 LAB — TSH: TSH: 2.07 u[IU]/mL (ref 0.35–5.50)

## 2013-09-10 NOTE — Progress Notes (Signed)
Pre visit review using our clinic review tool, if applicable. No additional management support is needed unless otherwise documented below in the visit note. 

## 2013-09-10 NOTE — Assessment & Plan Note (Signed)
Deteriorated.  Suspect that pt's sxs are all related to her ongoing depression.  Encouraged her to f/u w/ psych for med adjustment.

## 2013-09-10 NOTE — Progress Notes (Signed)
   Subjective:    Patient ID: April Harper, female    DOB: 02/21/1981, 33 y.o.   MRN: [id number]  Dizziness Associated symptoms include headaches.  Headache  Associated symptoms include dizziness.   Fatigue, dizziness, lack of energy, anorexia- not eating, PND, HAs (occipital), decreased motivation, sadness, tearfulness, irritability.  Is on Depakote and Wellbutrin for depression.  Pt has high stress job.  Scheduled appt w/ therapist- seeing weekly.  Also [facility]   Review of Systems  Neurological: Positive for dizziness and headaches.   For ROS see HPI     Objective:   Physical Exam  Vitals reviewed. Constitutional: She appears well-developed and well-nourished. No distress.  HENT:  Head: Normocephalic and atraumatic.  Right Ear: Tympanic membrane normal.  Left Ear: Tympanic membrane normal.  Nose: Mucosal edema and rhinorrhea present. Right sinus exhibits no maxillary sinus tenderness and no frontal sinus tenderness. Left sinus exhibits no maxillary sinus tenderness and no frontal sinus tenderness.  Mouth/Throat: Mucous membranes are normal. Posterior oropharyngeal erythema (w/ PND) present.  Eyes: Conjunctivae and EOM are normal. Pupils are equal, round, and reactive to light.  Neck: Normal range of motion. Neck supple.  Cardiovascular: Normal rate, regular rhythm and normal heart sounds.   Pulmonary/Chest: Effort normal and breath sounds normal. No respiratory distress. She has no wheezes. She has no rales.  Lymphadenopathy:    She has no cervical adenopathy.          Assessment & Plan:

## 2013-09-10 NOTE — Assessment & Plan Note (Signed)
Encouraged pt to start Zyrtec.  Will monitor for improvement.

## 2013-09-10 NOTE — Assessment & Plan Note (Signed)
New.  Suspect this is due to pt's worsening depression.  Check labs to r/o thyroid issue, anemia.  Encouraged pt to f/u w/ psych and counseling.  Will follow.

## 2013-09-10 NOTE — Telephone Encounter (Signed)
Ok w/ me 

## 2013-09-10 NOTE — Patient Instructions (Signed)
Follow up as needed Follow up with your psychiatrist for possible med adjustment Continue counseling weekly We'll notify you of your lab results and make any changes if needed Start Claritin or Zyrtec daily for the post nasal drip Call with any questions or concerns Hang in there!!!

## 2013-09-10 NOTE — Telephone Encounter (Signed)
Patient would like to switch from Dr. Drue NovelPaz to Dr. Beverely Lowabori. Is this okay?

## 2013-09-27 ENCOUNTER — Encounter: Payer: Self-pay | Admitting: Family Medicine

## 2013-09-27 ENCOUNTER — Ambulatory Visit (INDEPENDENT_AMBULATORY_CARE_PROVIDER_SITE_OTHER): Payer: BC Managed Care – PPO | Admitting: Family Medicine

## 2013-09-27 VITALS — BP 124/82 | HR 123 | Temp 98.2°F | Resp 16 | Wt 146.1 lb

## 2013-09-27 DIAGNOSIS — R000 Tachycardia, unspecified: Secondary | ICD-10-CM

## 2013-09-27 DIAGNOSIS — F3289 Other specified depressive episodes: Secondary | ICD-10-CM

## 2013-09-27 DIAGNOSIS — F329 Major depressive disorder, single episode, unspecified: Secondary | ICD-10-CM

## 2013-09-27 MED ORDER — METOPROLOL SUCCINATE ER 25 MG PO TB24: 25.0000 mg | ORAL_TABLET | Freq: Every day | ORAL | Status: DC

## 2013-09-27 NOTE — Progress Notes (Signed)
   Subjective:    Patient ID: April Harper, female    DOB: [birth year], 33 y.o.   MRN: [id number]  HPI New to establish.  Previous MD- April Harper  Depression/anxiety- called psych, no med changes.  Things have improved.  Appetite is slowly improving.  Sleep has improved.  Less fatigue.  Tachycardia- pt had w/u w/ Dr April Harper late 2013 early 2014.  holter monitor was normal.  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  Pt reports symptomatic tachycardia.  Labs at last visit show normal TSH, CBC.   Review of Systems For ROS see HPI     Objective:   Physical Exam  Vitals reviewed. Constitutional: She is oriented to person, place, and time. She appears well-developed and well-nourished. No distress.  HENT:  Head: Normocephalic and atraumatic.  Eyes: Conjunctivae and EOM are normal. Pupils are equal, round, and reactive to light.  Neck: Normal range of motion. Neck supple. No thyromegaly present.  Cardiovascular: Regular rhythm, normal heart sounds and intact distal pulses.   No murmur heard. Slightly tachy  Pulmonary/Chest: Effort normal and breath sounds normal. No respiratory distress.  Musculoskeletal: She exhibits no edema.  Lymphadenopathy:    She has no cervical adenopathy.  Neurological: She is alert and oriented to person, place, and time.  Skin: Skin is warm and dry.  Psychiatric: She has a normal mood and affect. Her behavior is normal.          Assessment & Plan:

## 2013-09-27 NOTE — Patient Instructions (Signed)
Follow up in 4 weeks to recheck BP and pulse Start the Metoprolol daily to decrease pulse Drink plenty of fluids Call with any questions or concerns Hang in there!!

## 2013-09-27 NOTE — Progress Notes (Signed)
Pre visit review using our clinic review tool, if applicable. No additional management support is needed unless otherwise documented below in the visit note. 

## 2013-09-27 NOTE — Assessment & Plan Note (Signed)
Pt has contacted psych and reports sxs are improving.  Will continue to monitor and assist as able.

## 2013-09-27 NOTE — Assessment & Plan Note (Signed)
New to provider, ongoing for pt.  Had normal holter, ECHO was WNL w/ exception of aneurysmal dilation of atrial septum.  Start low dose metoprolol to control heart rate and possibly improve anxiety.  Will follow.

## 2013-10-25 ENCOUNTER — Encounter: Payer: Self-pay | Admitting: Family Medicine

## 2013-10-25 ENCOUNTER — Ambulatory Visit (INDEPENDENT_AMBULATORY_CARE_PROVIDER_SITE_OTHER): Payer: BC Managed Care – PPO | Admitting: Family Medicine

## 2013-10-25 VITALS — BP 122/80 | HR 96 | Temp 98.2°F | Resp 16 | Wt 144.0 lb

## 2013-10-25 DIAGNOSIS — R000 Tachycardia, unspecified: Secondary | ICD-10-CM

## 2013-10-25 NOTE — Patient Instructions (Signed)
Schedule your complete physical at your convenience Continue the metoprolol daily Call with any questions or concerns [place]!!

## 2013-10-25 NOTE — Assessment & Plan Note (Signed)
Much improved on metoprolol.  Asymptomatic.  Anxiety has also lessened.  No changes.

## 2013-10-25 NOTE — Progress Notes (Signed)
   Subjective:    Patient ID: April RutherfordLindsay Harper, female    DOB: Dec 31, 1980, 33 y.o.   MRN: [id number]  HPI Tachycardia- noted at last visit.  Started on Metoprolol.  Feels 'less anxious', 'less wrapped up'.  No dizziness, had some initial transient fatigue but this improved.  Less palpitations.   Review of Systems For ROS see HPI     Objective:   Physical Exam  Vitals reviewed. Constitutional: She is oriented to person, place, and time. She appears well-developed and well-nourished. No distress.  HENT:  Head: Normocephalic and atraumatic.  Eyes: Conjunctivae and EOM are normal. Pupils are equal, round, and reactive to light.  Neck: Normal range of motion. Neck supple. No thyromegaly present.  Cardiovascular: Normal rate, regular rhythm, normal heart sounds and intact distal pulses.   No murmur heard. Pulmonary/Chest: Effort normal and breath sounds normal. No respiratory distress.  Abdominal: Soft. She exhibits no distension. There is no tenderness.  Musculoskeletal: She exhibits no edema.  Lymphadenopathy:    She has no cervical adenopathy.  Neurological: She is alert and oriented to person, place, and time.  Skin: Skin is warm and dry.  Psychiatric: She has a normal mood and affect. Her behavior is normal.          Assessment & Plan:

## 2013-10-25 NOTE — Progress Notes (Signed)
Pre visit review using our clinic review tool, if applicable. No additional management support is needed unless otherwise documented below in the visit note. 

## 2013-11-15 ENCOUNTER — Telehealth: Payer: Self-pay | Admitting: Family Medicine

## 2013-11-15 NOTE — Telephone Encounter (Signed)
Patient states that she has been having anxiety and panic attacks and wanted to know if the metoprolol should be increased?

## 2013-11-16 ENCOUNTER — Other Ambulatory Visit: Payer: Self-pay | Admitting: *Deleted

## 2013-11-16 NOTE — Telephone Encounter (Signed)
Called the pt to notify. LMOVM to return phone call and speak with Onalee HuaDavid in regards to US AirwaysPaz's message.

## 2013-11-16 NOTE — Telephone Encounter (Signed)
Ok to increase metoprolol from 25 mg to 50 mg but needs to monitor BPs (no less than 110/60) and pulse (no less than 55) F/u w/ PCP in 2-3 weeks

## 2013-11-22 ENCOUNTER — Ambulatory Visit (INDEPENDENT_AMBULATORY_CARE_PROVIDER_SITE_OTHER): Payer: BC Managed Care – PPO | Admitting: Family Medicine

## 2013-11-22 ENCOUNTER — Encounter: Payer: Self-pay | Admitting: Family Medicine

## 2013-11-22 VITALS — BP 140/100 | HR 105 | Temp 98.2°F | Resp 16 | Wt 144.2 lb

## 2013-11-22 DIAGNOSIS — R000 Tachycardia, unspecified: Secondary | ICD-10-CM

## 2013-11-22 DIAGNOSIS — I10 Essential (primary) hypertension: Secondary | ICD-10-CM

## 2013-11-22 DIAGNOSIS — F3289 Other specified depressive episodes: Secondary | ICD-10-CM

## 2013-11-22 DIAGNOSIS — F329 Major depressive disorder, single episode, unspecified: Secondary | ICD-10-CM

## 2013-11-22 MED ORDER — METOPROLOL SUCCINATE ER 50 MG PO TB24: 50.0000 mg | ORAL_TABLET | Freq: Every day | ORAL | Status: DC

## 2013-11-22 NOTE — Assessment & Plan Note (Signed)
Chronic problem.  Again elevated today.  Increase metoprolol to 50mg  daily.

## 2013-11-22 NOTE — Progress Notes (Signed)
Pre visit review using our clinic review tool, if applicable. No additional management support is needed unless otherwise documented below in the visit note. 

## 2013-11-22 NOTE — Patient Instructions (Signed)
Follow up in 1 month (sooner if needed) to recheck anxiety and heart rate Increase the Metoprolol to 50mg - 2 of what you have at home, 1 of the new script We'll call you with your cardiology appt Call with any questions or concerns Hang in there!!!

## 2013-11-22 NOTE — Assessment & Plan Note (Signed)
Pt continues to have issues w/ rapid heart rate and now reports irregular HR.  Pt in normal sinus rhythm today but w/ short PR interval.  Will refer to cardiology for evaluation (possible holter monitor) and any necessary treatment.  Increase metoprolol in the interim.  Will follow.

## 2013-11-22 NOTE — Progress Notes (Signed)
   Subjective:    Patient ID: April Harper, female    DOB: 1980-12-06, 33 y.o.   MRN: [id number]  Anxiety     Anxiety- deteriorated since 1st week of April when she had her '2nd ever full blown anxiety attack'.  Has seen psychiatry about this- is working to wean Wellbutrin and try a different medication.  Pt is feeling palpitations/irregular heart beat.  'it just felt off'.  No CP.  No SOB.  No N/V.   Review of Systems For ROS see HPI     Objective:   Physical Exam  Vitals reviewed. Constitutional: She is oriented to person, place, and time. She appears well-developed and well-nourished. No distress.  HENT:  Head: Normocephalic and atraumatic.  Eyes: Conjunctivae and EOM are normal. Pupils are equal, round, and reactive to light.  Neck: Normal range of motion. Neck supple. No thyromegaly present.  Cardiovascular: Normal rate, regular rhythm, normal heart sounds and intact distal pulses.   No murmur heard. Pulmonary/Chest: Effort normal and breath sounds normal. No respiratory distress.  Abdominal: Soft. She exhibits no distension. There is no tenderness.  Musculoskeletal: She exhibits no edema.  Lymphadenopathy:    She has no cervical adenopathy.  Neurological: She is alert and oriented to person, place, and time.  Skin: Skin is warm and dry.  Psychiatric: She has a normal mood and affect. Her behavior is normal.          Assessment & Plan:

## 2013-11-22 NOTE — Assessment & Plan Note (Signed)
Chronic problem.  Deteriorated.  Pt following w/ both psychiatry and counseling.  Will defer med management to them.  Will follow.

## 2013-11-23 ENCOUNTER — Telehealth: Payer: Self-pay | Admitting: Family Medicine

## 2013-11-23 NOTE — Telephone Encounter (Signed)
Relevant patient education assigned to patient using Emmi. ° °

## 2013-11-30 ENCOUNTER — Ambulatory Visit (INDEPENDENT_AMBULATORY_CARE_PROVIDER_SITE_OTHER): Payer: BC Managed Care – PPO | Admitting: Physician Assistant

## 2013-11-30 ENCOUNTER — Encounter: Payer: Self-pay | Admitting: Physician Assistant

## 2013-11-30 VITALS — BP 120/90 | HR 85 | Ht 67.5 in | Wt 144.0 lb

## 2013-11-30 DIAGNOSIS — R079 Chest pain, unspecified: Secondary | ICD-10-CM

## 2013-11-30 DIAGNOSIS — R002 Palpitations: Secondary | ICD-10-CM

## 2013-11-30 DIAGNOSIS — I10 Essential (primary) hypertension: Secondary | ICD-10-CM

## 2013-11-30 NOTE — Patient Instructions (Signed)
YOU WILL NEED TO DO A 24 HOUR URINE   Your physician has recommended that you wear an event monitor. Event monitors are medical devices that record the heart's electrical activity. Doctors most often us these monitors to diagnose arrhythmias. Arrhythmias are problems with the speed or rhythm of the heartbeat. The monitor is a small, portable device. You can wear one while you do your normal daily activities. This is usually used to diagnose what is causing palpitations/syncope (passing out).  Your physician recommends that you schedule a follow-up appointment in: 6 WEEKS WITH DR. Eden EmmsNISHAN  3 to 4 Gram Sodium Diet, No Added Salt (NAS) A 3 to 4 gram sodium diet restricts the amount of sodium in the diet to no more than 3 to 4 g or 3000 to 4000 mg daily. Limiting the amount of sodium is often used to help lower blood pressure. It is important if you have heart, liver, or kidney problems. Many foods contain sodium for flavor and sometimes as a preservative. When the amount of sodium in a diet needs to be low, it is important to know what to look for when choosing foods and drinks. The following includes some information and guidelines to help make it easier for you to adapt to a low sodium diet. QUICK TIPS  Do not add salt to food.  Avoid convenience items and fast food.  Choose unsalted snack foods.  Buy lower sodium products, often labeled as "lower sodium" or "no salt added."  Check food labels to learn how much sodium is in 1 serving.  When eating at a restaurant, ask that your food be prepared with less salt or none, if possible. READING FOOD LABELS FOR SODIUM INFORMATION The nutrition facts label is a good place to find how much sodium is in foods. Look for products with no more than 500 to 600 mg of sodium per meal and no more than 150 mg per serving. Remember that 3 to 4 g = 3000 to 4000 mg. The food label may also list foods as:  Sodium-free: Less than 5 mg in a serving.  Very low  sodium: 35 mg or less in a serving.  Low-sodium: 140 mg or less in a serving.  Light in sodium: 50% less sodium in a serving. For example, if a food that usually has 300 mg of sodium is changed to become light in sodium, it will have 150 mg of sodium.  Reduced sodium: 25% less sodium in a serving. For example, if a food that usually has 400 mg of sodium is changed to reduced sodium, it will have 300 mg of sodium. CHOOSING FOODS Grains  Avoid: Salted crackers and snack items. Bread stuffing and biscuit mixes. Seasoned rice or pasta mixes.  Choose: Unsalted snack items. English muffins, breads, and rolls. Homemade pancakes and waffles. Most cereals. Pasta. Meats  Avoid: Salted, canned, smoked, spiced, pickled meats, including fish and poultry. Bacon, ham, sausage, cold cuts, hot dogs, anchovies.  Choose: Low-sodium canned tuna and salmon. Fresh or frozen meat, poultry, and fish. Dairy  Avoid: Processed cheese and spreads. Cottage cheese. Buttermilk and condensed milk. Regular cheese.  Choose: Milk. Low-sodium cottage cheese. Yogurt. Sour cream. Low-sodium cheese. Fruits and Vegetables  Avoid: Regular canned vegetables. Regular canned tomato sauce and paste. Frozen vegetables in sauces. Olives. Rosita FirePickles. Relishes. Sauerkraut.  Choose: Low-sodium canned vegetables. Low-sodium tomato sauce and paste. Frozen or fresh vegetables. Fresh and frozen fruit. Condiments  Avoid: Canned and packaged gravies. Worcestershire sauce. Tartar sauce. Barbecue  sauce. Soy sauce. Steak sauce. Ketchup. Onion, garlic, and table salt. Meat flavorings and tenderizers.  Choose: Fresh and dried herbs and spices. Low-sodium varieties of mustard and ketchup. Lemon juice. Tabasco sauce. Horseradish. SAMPLE 3 TO 4 GRAM SODIUM MEAL PLAN  Breakfast / Sodium (mg)  1 cup low-fat milk / 143 mg  2 slices whole-wheat toast / 270 mg  1 tbs heart-healthy margarine / 153 mg  1 hard-boiled egg / 139 mg Lunch / Sodium  (mg)  1 cup raw carrots / 76 mg   cup hummus / 298 mg  1 cup low-fat milk / 143 mg   cup red grapes / 2 mg  1 cup low-sodium chicken and rice soup / 480 mg  10 low-sodium saltine crackers / 191 mg Dinner / Sodium (mg)  1 cup whole-wheat pasta / 2 mg  1 cup tomato sauce / 1178 mg  3 oz lean ground beef / 57 mg  1 small side salad (1 cup raw spinach leaves,  cup cucumber,  cup yellow bell pepper) / 25 mg  1 tsp ranch dressing / 144 mg Snack / Sodium (mg)  1 slice cheddar cheese / 258 mg  1 medium apple / 1 mg Nutrient Analysis  Calories: 2005  Protein: 85 g  Carbohydrate: 245 g  Fat: 78 g  Sodium: 3560 mg Document Released: 07/15/2005 Document Revised: 10/07/2011 Document Reviewed: 10/16/2009 [employer] Patient Information 2014 [employer] [place].

## 2013-11-30 NOTE — Progress Notes (Signed)
[address] [place], [place]  [address] Phone: [phone] Fax:  [phone]  Date:  11/30/2013   ID:  April RutherfordLindsay Hacking, DOB 05-Feb-1981, MRN [id number]  PCP:  Neena RhymesKatherine Tabori, MD  Cardiologist:  Dr. Charlton HawsPeter Nishan      History of Present Illness: April Harper is a 33 y.o. female with a hx of depression/anxiety, HTN, tachycardia.  Evaluated by Dr. Charlton HawsPeter Nishan in 2013 for tachycardia.  Echo was normal and Holter demonstrated NSR, PACs only.  Recently seen by PCP with worsening palpitations and she was referred back for evaluation.    Patient notes worsening blood pressure over the last 1-2 months.  She also notes episodes of rapid palpitations. This tends to occur at night and awakens her. Often it feels irregular. It sometimes occurs during the day. She does have associated flushing and diaphoresis. She's had an episode of chest tightness and dyspnea since she was seen by her PCP. She thinks that this may have been related to anxiety.  Otherwise she denies exertional chest discomfort or shortness of breath. She remains fairly active in her job and denies any limitations. She denies orthopnea PND or edema. She denies syncope.   Studies:   - Echo (06/2012):  EF 55-60%, no RWMA, atrial septal aneurysm  - Holter (06/2012):  NSR, PACs   Recent Labs: 09/10/2013: Creatinine 1.0; Hemoglobin 15.4*; Potassium 3.4*; TSH 2.07   Wt Readings from Last 3 Encounters:  11/30/13 144 lb (65.318 kg)  11/22/13 144 lb 4 oz (65.431 kg)  10/25/13 144 lb (65.318 kg)     Past Medical History  Diagnosis Date  . Dizziness and giddiness   . Depressive disorder, not elsewhere classified   . Paresthesias   . Tachycardia   . Obstructive hydrocephalus     confirmed by MRI  03-2010. patient was told she was a premature baby and diagnosed with hydrocephalus early in life  . Depression   . Kidney stones   . Bipolar 2 disorder     Current Outpatient Prescriptions  Medication Sig Dispense Refill    . buPROPion (WELLBUTRIN XL) 150 MG 24 hr tablet Take 150 mg by mouth daily.      . cetirizine (ZYRTEC) 10 MG tablet Take 10 mg by mouth daily.      . Cholecalciferol (VITAMIN D3) 2000 UNITS capsule Take 2,000 Units by mouth daily.      . divalproex (DEPAKOTE) 500 MG DR tablet Take 500 mg by mouth daily.       . fluticasone (FLONASE) 50 MCG/ACT nasal spray Place 2 sprays into the nose daily as needed.      Marland Kitchen. LORazepam (ATIVAN) 0.5 MG tablet Take 0.5 mg by mouth as needed.      . metoprolol succinate (TOPROL-XL) 50 MG 24 hr tablet Take 1 tablet (50 mg total) by mouth daily. Take with or immediately following a meal.  30 tablet  3  . Multiple Vitamin (MULTIVITAMIN) tablet Take 1 tablet by mouth daily.       No current facility-administered medications for this visit.    Allergies:   Review of patient's allergies indicates no known allergies.   Social History:  The patient  reports that she has never smoked. She has never used smokeless tobacco. She reports that she does not drink alcohol or use illicit drugs.   Family History:  The patient's family history includes COPD in her paternal grandmother; Cancer in her paternal grandfather; Emphysema in her paternal grandmother; Hypertension in her  mother; Pneumonia (age of onset: 7339) in her father.   ROS:  Please see the history of present illness.      All other systems reviewed and negative.   PHYSICAL EXAM: VS:  BP 120/90  Pulse 85  Ht 5' 7.5" (1.715 m)  Wt 144 lb (65.318 kg)  BMI 22.21 kg/m2 Well nourished, well developed, in no acute distress HEENT: normal Neck: no JVD Cardiac:  normal S1, S2; RRR; no murmur Lungs:  clear to auscultation bilaterally, no wheezing, rhonchi or rales Abd: soft, nontender, no hepatomegaly, no bruit Ext: no edema Skin: warm and dry Neuro:  CNs 2-12 intact, no focal abnormalities noted  EKG:  NSR, HR 85, iRBBB, NSSTTW changes (no delta waves noted)      ASSESSMENT AND PLAN:  1. Palpitations: Etiology  not clear. She often has rapid heartbeats as well as associated flushing and diaphoresis. Her palpitations sometimes feel irregular. I have recommended proceeding with a 21 day event monitor. We will also arrange a 24-hour urine for catecholamines and metanephrines.  Recent TSH was normal. Echocardiogram in 2013 was normal. At this point, I do not think we need to repeat her echocardiogram. 2. Unspecified essential hypertension:  I have asked her to increase Toprol to 50 mg QD as Dr. Beverely Lowabori asked her.  I have also asked her to limit her salt.  Could consider renal artery US if BP remains difficult to control to r/o FMD. 3. Chest pain, unspecified:  Very atypical and likely related to anxiety.  No further workup necessary at this time.  Consider stress testing (ETT) if she has recurrent CP. 4. Disposition:  F/u with Dr. Charlton HawsPeter Nishan in 4-6 weeks.  Signed, Tereso NewcomerScott Weaver, PA-C  11/30/2013 3:23 PM

## 2013-12-01 ENCOUNTER — Encounter: Payer: Self-pay | Admitting: *Deleted

## 2013-12-01 ENCOUNTER — Encounter (INDEPENDENT_AMBULATORY_CARE_PROVIDER_SITE_OTHER): Payer: BC Managed Care – PPO

## 2013-12-01 DIAGNOSIS — I10 Essential (primary) hypertension: Secondary | ICD-10-CM

## 2013-12-01 DIAGNOSIS — R002 Palpitations: Secondary | ICD-10-CM

## 2013-12-01 DIAGNOSIS — R079 Chest pain, unspecified: Secondary | ICD-10-CM

## 2013-12-01 NOTE — Progress Notes (Signed)
Patient ID: [name], female   DOB: 03-19-1981, 33 y.o.   MRN: [id number] E-Cardio verite 30 day cardiac event monitor applied to patient. Patient instructed ok to terminate after 21 days, if symptoms captured,  per order from Kindred HealthcareScott Weaver, PA-C.

## 2013-12-08 ENCOUNTER — Telehealth: Payer: Self-pay | Admitting: Cardiovascular Disease

## 2013-12-08 NOTE — Telephone Encounter (Signed)
New message ° ° ° ° °

## 2013-12-09 ENCOUNTER — Telehealth: Payer: Self-pay

## 2013-12-09 NOTE — Telephone Encounter (Signed)
C/o:  Red circular shaped rashes- 2 on chest and 2 underneath her breast.  She also has a rash located under both of her arms.  She has been wearing a holster monitor since last Wednesday and on Friday noted the rash on her trunk.  Since then the rash has progressively gotten worse and has spread under her arms.  Denies throat itching, swollen tongue, or shortness of breath.    Onset:  Last Friday  Patient has not worn her monitor in 2 days.  She was instructed by her Cardiologist to wear monitor for 21 days.  Advice given:  Take OTC Benadryl, apply anti-itch ointment to rashes, and contact Cardiology to make them aware of reaction and to receive further instruction regarding holster monitor.  If symptoms worsen, to contact office for further direction.   She stated understanding and agreed with plan.

## 2013-12-21 ENCOUNTER — Telehealth: Payer: Self-pay | Admitting: *Deleted

## 2013-12-21 NOTE — Telephone Encounter (Signed)
x

## 2013-12-23 ENCOUNTER — Encounter: Payer: Self-pay | Admitting: Cardiovascular Disease

## 2013-12-23 ENCOUNTER — Telehealth: Payer: Self-pay | Admitting: *Deleted

## 2013-12-23 NOTE — Telephone Encounter (Signed)
Will forward to shelley

## 2013-12-23 NOTE — Telephone Encounter (Signed)
New message    Spoken on Monday - returning call back to nurse - discuss heart monitor.

## 2013-12-23 NOTE — Telephone Encounter (Signed)
LMAM  Discussed monitor with Scherrie Bateman, RN.   Patient sensitive/ allergic to electrodes.  She tried multiple brands but was unable to tolerate.  Cardiac event monitor terminated after 10 recording days.  Patient offered  type of monitor that would be held to chest during symptoms, however, I was told she would be unable to use because it requires a land line.  Patient informed of a non prescription device that attaches to an I-Phone called [email or web address].  With this she could record her heart rhythm during symptoms and review this recording with her physician at her convenience, without the need for electrodes.

## 2014-01-19 ENCOUNTER — Ambulatory Visit (INDEPENDENT_AMBULATORY_CARE_PROVIDER_SITE_OTHER): Payer: BC Managed Care – PPO | Admitting: Cardiovascular Disease

## 2014-01-19 ENCOUNTER — Encounter: Payer: Self-pay | Admitting: Cardiovascular Disease

## 2014-01-19 VITALS — BP 106/80 | HR 99 | Ht 67.0 in | Wt 146.4 lb

## 2014-01-19 DIAGNOSIS — R000 Tachycardia, unspecified: Secondary | ICD-10-CM

## 2014-01-19 DIAGNOSIS — I10 Essential (primary) hypertension: Secondary | ICD-10-CM

## 2014-01-19 NOTE — Assessment & Plan Note (Signed)
Well controlled.  Continue current medications and low sodium Dash type diet.    

## 2014-01-19 NOTE — Assessment & Plan Note (Signed)
No evidence of arrhythmia  Continue beta blocker  Likely more related to anxiety  Phone app works well and no PAF seen with symptoms

## 2014-01-19 NOTE — Progress Notes (Signed)
Patient ID: April Harper, female   DOB: 02/12/1981, 33 y.o.   MRN: [id number] 33 yo initially seen for  dizzyness and tachycardia. 2013  Has had issues with this for over 2 years. History of bipolar disease on depokote as mood stabilizer and welbutrin for depression. Interestingly has identical twin sister who does not manifest this. Changes happened when she returned from college. Occasional palpitations no syncope. Has been worse with stress but not always related. No chest pain or dyspnea. Symptoms not postural. Occasional sensation that she is moving in the room and nausea. Denies caffiene, stimulants. TSH always normal. No other autoimmune symptoms.  - Echo (06/2012): EF 55-60%, no RWMA, atrial septal aneurysm  - Holter (06/2012): NSR, PACs  Seen by PA May for palpitations  Beta blocker increased   Phone app monitor normal not even PaC;s   Alive Cor which actually had nice morphology tracings    ROS: Denies fever, malais, weight loss, blurry vision, decreased visual acuity, cough, sputum, SOB, hemoptysis, pleuritic pain, palpitaitons, heartburn, abdominal pain, melena, lower extremity edema, claudication, or rash.  All other systems reviewed and negative  General: Affect appropriate Healthy:  appears stated age HEENT: normal Neck supple with no adenopathy JVP normal no bruits no thyromegaly Lungs clear with no wheezing and good diaphragmatic motion Heart:  S1/S2 no murmur, no rub, gallop or click PMI normal Abdomen: benighn, BS positve, no tenderness, no AAA no bruit.  No HSM or HJR Distal pulses intact with no bruits No edema Neuro non-focal Skin warm and dry No muscular weakness   Current Outpatient Prescriptions  Medication Sig Dispense Refill  . buPROPion (WELLBUTRIN XL) 150 MG 24 hr tablet Take 150 mg by mouth daily.      . cetirizine (ZYRTEC) 10 MG tablet Take 10 mg by mouth daily.      . Cholecalciferol (VITAMIN D3) 2000 UNITS capsule Take 2,000 Units by mouth daily.       . divalproex (DEPAKOTE) 500 MG DR tablet Take 500 mg by mouth daily.       . fluticasone (FLONASE) 50 MCG/ACT nasal spray Place 2 sprays into the nose daily as needed.      Marland Kitchen. LORazepam (ATIVAN) 0.5 MG tablet Take 0.5 mg by mouth as needed.      . metoprolol succinate (TOPROL-XL) 50 MG 24 hr tablet Take 1 tablet (50 mg total) by mouth daily. Take with or immediately following a meal.  30 tablet  3  . Multiple Vitamin (MULTIVITAMIN) tablet Take 1 tablet by mouth daily.       No current facility-administered medications for this visit.    Allergies  Review of patient's allergies indicates no known allergies.  Electrocardiogram:  SR normal   Assessment and Plan

## 2014-01-19 NOTE — Patient Instructions (Signed)
Your physician recommends that you schedule a follow-up appointment in: AS NEEDED  Your physician recommends that you continue on your current medications as directed. Please refer to the Current Medication list given to you today.  

## 2014-02-12 ENCOUNTER — Other Ambulatory Visit: Payer: Self-pay | Admitting: Family Medicine

## 2014-02-14 ENCOUNTER — Telehealth: Payer: Self-pay | Admitting: Family Medicine

## 2014-02-14 NOTE — Telephone Encounter (Signed)
Med filled.  

## 2014-02-14 NOTE — Telephone Encounter (Signed)
Yes, it can cause lower BP.  That BP is completely fine as long as she is not feeling dizzy.  She should increase her fluid intake which will help raise BP

## 2014-02-14 NOTE — Telephone Encounter (Signed)
Poke with pt wh advises that she is having dizziness and headaches. Pt states she will drink more water. appt was made for 7/22 for further evaluation.

## 2014-02-14 NOTE — Telephone Encounter (Signed)
Caller name: April Harper  Call back number:[phone]   Reason for call:  Pt states she is having a lower bp than usual (99/60 this am) and she wants to know if the metoprolol succinate (TOPROL-XL) 25 MG 24 hr  Is causing her to have the low BP.

## 2014-02-16 ENCOUNTER — Encounter: Payer: Self-pay | Admitting: Family Medicine

## 2014-02-16 ENCOUNTER — Ambulatory Visit (INDEPENDENT_AMBULATORY_CARE_PROVIDER_SITE_OTHER): Payer: BC Managed Care – PPO | Admitting: Family Medicine

## 2014-02-16 VITALS — BP 140/100 | HR 87 | Temp 98.0°F | Resp 16 | Wt 150.1 lb

## 2014-02-16 DIAGNOSIS — R42 Dizziness and giddiness: Secondary | ICD-10-CM

## 2014-02-16 NOTE — Progress Notes (Signed)
Pre visit review using our clinic review tool, if applicable. No additional management support is needed unless otherwise documented below in the visit note. 

## 2014-02-16 NOTE — Progress Notes (Signed)
   Subjective:    Patient ID: April RutherfordLindsay Harland, female    DOB: [birth year], 33 y.o.   MRN: [id number]  HPI Dizziness- sxs started 4 days ago.  Will occur randomly- while sitting at computer, will wake her from sleep.  sxs seem to improve w/ food and water.  BP has been elevated.  Yesterday sxs improved w/ Metoprolol but this tends to cause sleepiness.  Currently taking 50mg  daily.  Some increased anxiety.  Drinking plenty of water.     Review of Systems For ROS see HPI     Objective:   Physical Exam  Vitals reviewed. Constitutional: She is oriented to person, place, and time. She appears well-developed and well-nourished. No distress.  HENT:  Head: Normocephalic and atraumatic.  Eyes: Conjunctivae and EOM are normal. Pupils are equal, round, and reactive to light.  Neck: Normal range of motion. Neck supple. No thyromegaly present.  Cardiovascular: Normal rate, regular rhythm, normal heart sounds and intact distal pulses.   No murmur heard. Pulmonary/Chest: Effort normal and breath sounds normal. No respiratory distress.  Abdominal: Soft. She exhibits no distension. There is no tenderness.  Musculoskeletal: She exhibits no edema.  Lymphadenopathy:    She has no cervical adenopathy.  Neurological: She is alert and oriented to person, place, and time.  Skin: Skin is warm and dry.  Psychiatric: She has a normal mood and affect. Her behavior is normal.          Assessment & Plan:

## 2014-02-16 NOTE — Patient Instructions (Signed)
Follow up in 4-6 weeks to recheck BP Decrease the Metoprolol to 25mg  daily Drink plenty of fluids Try and eat protein at each meal to stabilize blood sugar- you may need a mid morning, mid afternoon, and evening snack We'll notify you of your lab results and make any changes if needed Call with any questions or concerns Hang in there!

## 2014-02-17 ENCOUNTER — Ambulatory Visit: Payer: Self-pay | Admitting: Family Medicine

## 2014-02-17 LAB — CBC WITH DIFFERENTIAL/PLATELET
BASOS PCT: 0.2 % (ref 0.0–3.0)
Basophils Absolute: 0 10*3/uL (ref 0.0–0.1)
EOS ABS: 0 10*3/uL (ref 0.0–0.7)
EOS PCT: 0.2 % (ref 0.0–5.0)
HCT: 42.9 % (ref 36.0–46.0)
Hemoglobin: 14.5 g/dL (ref 12.0–15.0)
LYMPHS PCT: 18.2 % (ref 12.0–46.0)
Lymphs Abs: 1.7 10*3/uL (ref 0.7–4.0)
MCHC: 33.8 g/dL (ref 30.0–36.0)
MCV: 91.4 fl (ref 78.0–100.0)
Monocytes Absolute: 0.6 10*3/uL (ref 0.1–1.0)
Monocytes Relative: 6.8 % (ref 3.0–12.0)
NEUTROS PCT: 74.6 % (ref 43.0–77.0)
Neutro Abs: 7.1 10*3/uL (ref 1.4–7.7)
Platelets: 176 10*3/uL (ref 150.0–400.0)
RBC: 4.69 Mil/uL (ref 3.87–5.11)
RDW: 12.7 % (ref 11.5–15.5)
WBC: 9.5 10*3/uL (ref 4.0–10.5)

## 2014-02-17 LAB — BASIC METABOLIC PANEL
BUN: 16 mg/dL (ref 6–23)
CALCIUM: 9.7 mg/dL (ref 8.4–10.5)
CO2: 27 mEq/L (ref 19–32)
Chloride: 106 mEq/L (ref 96–112)
Creatinine, Ser: 0.8 mg/dL (ref 0.4–1.2)
GFR: 83.94 mL/min (ref >60.00)
Glucose, Bld: 74 mg/dL (ref 70–99)
Potassium: 4.6 mEq/L (ref 3.5–5.1)
SODIUM: 141 meq/L (ref 135–145)

## 2014-02-17 LAB — TSH: TSH: 2.23 u[IU]/mL (ref 0.35–4.50)

## 2014-02-20 NOTE — Assessment & Plan Note (Signed)
New.  Suspect this is multifactorial- use of beta blocker, hypoglycemia between meals, heat, limited hydration.  Decreased Metoprolol to 25mg .  Increase fluid intake.  Stressed need to eat regularly and include protein at each meal if possible.  Check labs to r/o underlying metabolic cause.  Will follow.

## 2014-03-22 ENCOUNTER — Encounter: Payer: Self-pay | Admitting: Family Medicine

## 2014-03-22 ENCOUNTER — Ambulatory Visit (INDEPENDENT_AMBULATORY_CARE_PROVIDER_SITE_OTHER): Payer: BC Managed Care – PPO | Admitting: Family Medicine

## 2014-03-22 VITALS — BP 112/72 | HR 90 | Temp 98.4°F | Ht <= 58 in | Wt 151.2 lb

## 2014-03-22 DIAGNOSIS — L253 Unspecified contact dermatitis due to other chemical products: Secondary | ICD-10-CM

## 2014-03-22 MED ORDER — PREDNISONE 10 MG PO TABS: ORAL_TABLET | ORAL | Status: DC

## 2014-03-22 NOTE — Patient Instructions (Signed)

## 2014-03-22 NOTE — Progress Notes (Signed)
Pre visit review using our clinic review tool, if applicable. No additional management support is needed unless otherwise documented below in the visit note. 

## 2014-03-22 NOTE — Progress Notes (Signed)
  Subjective:     April Harper is a 33 y.o. female who presents for evaluation of a rash involving the upper extremity. Rash started a few days ago. Lesions are pink, and raised in texture. Rash has changed over time. Rash is pruritic. Associated symptoms: none. Patient denies: abdominal pain, arthralgia, congestion, cough, crankiness, decrease in appetite, decrease in energy level, fever, headache, irritability, myalgia, nausea, sore throat and vomiting. Patient has not had contacts with similar rash. Patient has had new exposures (soaps, lotions, laundry detergents, foods, medications, plants, insects or animals).----bleach while cleaning new AC  The following portions of the patient's history were reviewed and updated as appropriate: allergies, current medications, past family history, past medical history, past social history, past surgical history and problem list.  Review of Systems Pertinent items are noted in HPI.    Objective:    BP 112/72  Pulse 90  Temp(Src) 98.4 F (36.9 C) (Oral)  Ht 5.5" (0.14 m)  Wt 151 lb 3.2 oz (68.584 kg)  BMI 3499.18 kg/m2  SpO2 98%  LMP 03/02/2014 General:  alert, cooperative, appears stated age and no distress  Skin:  papules noted on upper extremties  and urticaria     Assessment:    contact dermatitis: chemicals bleach?    Plan:    Medications: benadryl and steroids: pred taper. Verbal patient instruction given. Follow up in a few days. ---prn

## 2014-03-25 ENCOUNTER — Encounter: Payer: Self-pay | Admitting: Medical

## 2014-03-25 ENCOUNTER — Ambulatory Visit (INDEPENDENT_AMBULATORY_CARE_PROVIDER_SITE_OTHER): Payer: BC Managed Care – PPO | Admitting: Medical

## 2014-03-25 VITALS — BP 125/70 | HR 77 | Temp 98.0°F | Wt 150.0 lb

## 2014-03-25 DIAGNOSIS — Z5189 Encounter for other specified aftercare: Secondary | ICD-10-CM

## 2014-03-25 DIAGNOSIS — T7840XD Allergy, unspecified, subsequent encounter: Secondary | ICD-10-CM

## 2014-03-25 DIAGNOSIS — T7840XA Allergy, unspecified, initial encounter: Secondary | ICD-10-CM

## 2014-03-25 LAB — WOUND CULTURE
GRAM STAIN: NONE SEEN
Gram Stain: NONE SEEN
Gram Stain: NONE SEEN
Organism ID, Bacteria: NO GROWTH

## 2014-03-25 MED ORDER — METHYLPREDNISOLONE ACETATE 80 MG/ML IJ SUSP
80.0000 mg | Freq: Once | INTRAMUSCULAR | Status: AC
  Administered 2014-03-25: 80 mg via INTRAMUSCULAR

## 2014-03-25 MED ORDER — PREDNISONE 10 MG PO TABS: 10.0000 mg | ORAL_TABLET | Freq: Every day | ORAL | Status: DC

## 2014-03-25 MED ORDER — HYDROXYZINE HCL 10 MG PO TABS: 10.0000 mg | ORAL_TABLET | Freq: Three times a day (TID) | ORAL | Status: DC | PRN

## 2014-03-25 NOTE — Progress Notes (Signed)
   Subjective:    Patient ID: April Harper, female    DOB: May 16, 1981, 33 y.o.   MRN: [id number]  HPI  Pt in for follow up for scattered rash. Rash since Monday morning. Initially only group of 4 raised papules. Since then more diffuse rash areas. One on forearm, rt tricep area and on her back. Pt may have had some exposure to bleach. Then speculates maybe insect bites. No sharp stinging pain.  LMP- august.    Review of Systems  Constitutional: Negative for fever, chills and fatigue.  HENT: Negative.   Respiratory: Negative for cough, chest tightness and wheezing.   Cardiovascular: Negative for chest pain and palpitations.  Skin: Positive for rash.       Itching as described on HPI.  Hematological: Negative for adenopathy. Does not bruise/bleed easily.       Objective:   Physical Exam  General- NAD, Pleasant. Lungs-Clear even unlabored. Heart- Regular,rate, and rythm. Rt medial forearm- 5 grouped papules. Rt forearm posterior  2.0 almost hive appearance. Rt tricep- group of 4 papulses. Lt wrist- one small papule. Note no vesicles seen. Rt trapezius area/medial to scapula has 3 raised red areas like hive.  Lt upper eye lid- one small papule.        Assessment & Plan:

## 2014-03-25 NOTE — Patient Instructions (Signed)
For your likely allergic reaction please continue current rx of prednisone Dr. Laury Axon gave you. Then take another 3 days of prednisone as I prescribed. I wrote your for hydroxyzine for the itch. Please stop benadryl. We gave you depomedrol im injection today. Your rash should gradually improve. If persisting or worsening notify us. Follow up in 7 days or as needed.

## 2014-03-25 NOTE — Addendum Note (Signed)
Addended by: Eustace Quail on: 03/25/2014 10:30 AM   Modules accepted: Orders

## 2014-03-25 NOTE — Assessment & Plan Note (Signed)
Exact etiology undetermined. May be contact dermatitis vs bleach expsoure. Did not respond to low dose short taper prednisone. But at time came in was very limited presentation. Now worse. Pt willing to treat more aggressive. Gave depomedrol im in office. Rx hydroxyxine. Stop benadry. After finshed 3 day taper predsnisone. Extend another 3 days at low dose of 10 mg q day.

## 2014-03-25 NOTE — Progress Notes (Signed)
Pre visit review using our clinic review tool, if applicable. No additional management support is needed unless otherwise documented below in the visit note. 

## 2014-03-28 ENCOUNTER — Telehealth: Payer: Self-pay

## 2014-03-28 DIAGNOSIS — T7840XD Allergy, unspecified, subsequent encounter: Secondary | ICD-10-CM

## 2014-03-28 NOTE — Telephone Encounter (Signed)
April Harper [phone]  [name] rash now spreading other arm and neck area, she would referral to a dermatologist.

## 2014-03-31 ENCOUNTER — Telehealth: Payer: Self-pay | Admitting: Medical

## 2014-03-31 DIAGNOSIS — T7840XD Allergy, unspecified, subsequent encounter: Secondary | ICD-10-CM

## 2014-03-31 NOTE — Telephone Encounter (Signed)
Disregard the newest referral. Pt already saw dermatologist. I got second request to refer her then found out later she already saw one.

## 2014-04-01 NOTE — Telephone Encounter (Signed)
Patient has been seen by Dermatology for problem noted below.

## 2014-06-22 ENCOUNTER — Other Ambulatory Visit: Payer: Self-pay | Admitting: General Practice

## 2014-06-22 MED ORDER — METOPROLOL SUCCINATE ER 25 MG PO TB24: ORAL_TABLET | ORAL | Status: DC

## 2014-07-28 ENCOUNTER — Encounter: Payer: Self-pay | Admitting: Family Medicine

## 2014-07-28 ENCOUNTER — Ambulatory Visit (INDEPENDENT_AMBULATORY_CARE_PROVIDER_SITE_OTHER): Payer: BC Managed Care – PPO | Admitting: Family Medicine

## 2014-07-28 VITALS — BP 122/78 | HR 95 | Temp 98.2°F | Resp 14 | Wt 148.8 lb

## 2014-07-28 DIAGNOSIS — R350 Frequency of micturition: Secondary | ICD-10-CM

## 2014-07-28 DIAGNOSIS — N3000 Acute cystitis without hematuria: Secondary | ICD-10-CM

## 2014-07-28 DIAGNOSIS — R82998 Other abnormal findings in urine: Secondary | ICD-10-CM

## 2014-07-28 DIAGNOSIS — N390 Urinary tract infection, site not specified: Secondary | ICD-10-CM

## 2014-07-28 DIAGNOSIS — R1084 Generalized abdominal pain: Secondary | ICD-10-CM

## 2014-07-28 LAB — POCT URINALYSIS DIPSTICK
Bilirubin, UA: NEGATIVE
Blood, UA: NEGATIVE
Glucose, UA: NEGATIVE
Ketones, UA: NEGATIVE
Nitrite, UA: NEGATIVE
PROTEIN UA: NEGATIVE
SPEC GRAV UA: 1.02
UROBILINOGEN UA: 2
pH, UA: 6

## 2014-07-28 MED ORDER — CEPHALEXIN 500 MG PO CAPS: 500.0000 mg | ORAL_CAPSULE | Freq: Two times a day (BID) | ORAL | Status: AC

## 2014-07-28 NOTE — Patient Instructions (Signed)
Follow up as needed Start the Keflex twice daily for presumed UTI Drink plenty of fluids Call with any questions or concerns Happy New Year!!!

## 2014-07-28 NOTE — Progress Notes (Signed)
   Subjective:    Patient ID: April Harper, female    DOB: [birth year], 33 y.o.   MRN: [id number]  HPI UTI- abd/pelvic pain started 5-6 weeks ago.  Was sporadic until the last week when it became more constant.  Also had some L back pain.  Increased bladder pressure.  Some frequency.  No dysuria.  No hematuria.  No fevers.  No N/V.   Review of Systems For ROS see HPI     Objective:   Physical Exam  Constitutional: She appears well-developed and well-nourished. No distress.  Abdominal: Soft. She exhibits no distension. There is tenderness (+ suprapubic but no CVA tenderness ).  Vitals reviewed.         Assessment & Plan:

## 2014-07-28 NOTE — Addendum Note (Signed)
Addended by: Silvio PateHOMPSON, Alyn Jurney D on: 07/28/2014 12:04 PM   Modules accepted: Orders

## 2014-07-28 NOTE — Assessment & Plan Note (Signed)
Pt's sxs acutely changed in the last week and are consistent w/ UTI.  UA consistent w/ infxn.  Start keflex.  Await culture results.  Will adjust meds prn.

## 2014-07-28 NOTE — Progress Notes (Signed)
Pre visit review using our clinic review tool, if applicable. No additional management support is needed unless otherwise documented below in the visit note. 

## 2014-07-29 LAB — URINE CULTURE
Colony Count: NO GROWTH
Organism ID, Bacteria: NO GROWTH

## 2014-08-01 ENCOUNTER — Ambulatory Visit: Payer: BC Managed Care – PPO | Admitting: Family Medicine

## 2014-08-02 ENCOUNTER — Telehealth: Payer: Self-pay | Admitting: Family Medicine

## 2014-08-02 DIAGNOSIS — R3989 Other symptoms and signs involving the genitourinary system: Secondary | ICD-10-CM

## 2014-08-02 DIAGNOSIS — R350 Frequency of micturition: Secondary | ICD-10-CM

## 2014-08-02 NOTE — Telephone Encounter (Signed)
Caller name:Eide, Lillia AbedLindsay Relation to UJ:WJXBpt:self Call back [phone] Pharmacy:  Reason for call: pt was seen 07/28/14 for poss uti, pt states she was given an antibiotic and she has completely it , however she states she still has pressure on her bladder and still frequency in urinating, would like to know what other options she has.

## 2014-08-02 NOTE — Telephone Encounter (Signed)
Spoke with pt who advised that she would be ok for referral to urology, this was placed today.

## 2014-08-02 NOTE — Telephone Encounter (Signed)
Since pt did not have UTI at time of last OV, would recommend urology referral for complete evaluation of symptoms

## 2014-08-08 ENCOUNTER — Telehealth: Payer: Self-pay | Admitting: Family Medicine

## 2014-08-08 NOTE — Telephone Encounter (Signed)
Advised pt, stated she would like a pap, pt scheduled for 2:30pm on Thursday 1/14.

## 2014-08-08 NOTE — Telephone Encounter (Signed)
Caller name: Roe Rutherforducker, Jasdeep Relation to pt: self  Call back number: [phone]   Reason for call:  Pt states urologist will not be able to see her until 08/29/14. Pt would like to know if she should schedule a pap?

## 2014-08-11 ENCOUNTER — Other Ambulatory Visit (HOSPITAL_COMMUNITY)
Admission: RE | Admit: 2014-08-11 | Discharge: 2014-08-11 | Disposition: A | Discharge status: Home / Self Care | Payer: BLUE CROSS/BLUE SHIELD | Source: Ambulatory Visit | Attending: Family Medicine | Admitting: Family Medicine

## 2014-08-11 ENCOUNTER — Ambulatory Visit (INDEPENDENT_AMBULATORY_CARE_PROVIDER_SITE_OTHER): Payer: BLUE CROSS/BLUE SHIELD | Admitting: Family Medicine

## 2014-08-11 ENCOUNTER — Encounter: Payer: Self-pay | Admitting: Family Medicine

## 2014-08-11 VITALS — BP 145/92 | HR 93 | Temp 98.0°F | Resp 14 | Wt 147.7 lb

## 2014-08-11 DIAGNOSIS — Z01419 Encounter for gynecological examination (general) (routine) without abnormal findings: Secondary | ICD-10-CM | POA: Diagnosis not present

## 2014-08-11 DIAGNOSIS — Z124 Encounter for screening for malignant neoplasm of cervix: Secondary | ICD-10-CM | POA: Insufficient documentation

## 2014-08-11 DIAGNOSIS — Z0000 Encounter for general adult medical examination without abnormal findings: Secondary | ICD-10-CM | POA: Insufficient documentation

## 2014-08-11 DIAGNOSIS — E559 Vitamin D deficiency, unspecified: Secondary | ICD-10-CM

## 2014-08-11 DIAGNOSIS — Z1151 Encounter for screening for human papillomavirus (HPV): Secondary | ICD-10-CM | POA: Diagnosis present

## 2014-08-11 DIAGNOSIS — R319 Hematuria, unspecified: Secondary | ICD-10-CM

## 2014-08-11 LAB — POCT URINALYSIS DIPSTICK
BILIRUBIN UA: NEGATIVE
GLUCOSE UA: NEGATIVE
KETONES UA: NEGATIVE
Leukocytes, UA: NEGATIVE
Nitrite, UA: NEGATIVE
Protein, UA: NEGATIVE
SPEC GRAV UA: 1.01
UROBILINOGEN UA: NEGATIVE
pH, UA: 6.5

## 2014-08-11 NOTE — Patient Instructions (Signed)
We'll notify you of your lab results and make any changes if needed Drink plenty of fluids Keep up the good work on healthy diet and regular exercise Call with any questions or concerns Happy New Year and Happy Early [place] OuchBirthday!

## 2014-08-11 NOTE — Progress Notes (Signed)
   Subjective:    Patient ID: April Harper, female    DOB: 1980/12/08, 34 y.o.   MRN: [id number]  HPI CPE- due for pap.  No concerns.   Review of Systems Patient reports no vision/ hearing changes, adenopathy,fever, weight change,  persistant/recurrent hoarseness , swallowing issues, chest pain, palpitations, edema, persistant/recurrent cough, hemoptysis, gastrointestinal bleeding (melena, rectal bleeding), significant heartburn, bowel changes, Gyn symptoms (abnormal  bleeding, pain),  syncope, focal weakness, memory loss, numbness & tingling, skin/hair/nail changes, abnormal bruising or bleeding.   + SOB w/ anxiety + pelvic pain + urinary frequency and pressure + anxiety    Objective:   Physical Exam  General Appearance:    Alert, cooperative, no distress, appears stated age  Head:    Normocephalic, without obvious abnormality, atraumatic  Eyes:    PERRL, conjunctiva/corneas clear, EOM's intact, fundi    benign, both eyes  Ears:    Normal TM's and external ear canals, both ears  Nose:   Nares normal, septum midline, mucosa normal, no drainage    or sinus tenderness  Throat:   Lips, mucosa, and tongue normal; teeth and gums normal  Neck:   Supple, symmetrical, trachea midline, no adenopathy;    Thyroid: no enlargement/tenderness/nodules  Back:     Symmetric, no curvature, ROM normal, no CVA tenderness  Lungs:     Clear to auscultation bilaterally, respirations unlabored  Chest Wall:    No tenderness or deformity   Heart:    Regular rate and rhythm, S1 and S2 normal, no murmur, rub   or gallop  Breast Exam:    No tenderness, masses, or nipple abnormality  Abdomen:     Soft, non-tender, bowel sounds active all four quadrants,    no masses, no organomegaly  Genitalia:    External genitalia normal, cervix normal in appearance, no CMT, uterus in normal size and position, adnexa w/out mass or tenderness, mucosa pink and moist, no lesions or discharge present  Rectal:    Normal  external appearance  Extremities:   Extremities normal, atraumatic, no cyanosis or edema  Pulses:   2+ and symmetric all extremities  Skin:   Skin color, texture, turgor normal, no rashes or lesions  Lymph nodes:   Cervical, supraclavicular, and axillary nodes normal  Neurologic:   CNII-XII intact, normal strength, sensation and reflexes    throughout          Assessment & Plan:

## 2014-08-11 NOTE — Progress Notes (Signed)
Pre visit review using our clinic review tool, if applicable. No additional management support is needed unless otherwise documented below in the visit note. 

## 2014-08-12 LAB — BASIC METABOLIC PANEL
BUN: 16 mg/dL (ref 6–23)
CALCIUM: 9.7 mg/dL (ref 8.4–10.5)
CHLORIDE: 105 meq/L (ref 96–112)
CO2: 27 mEq/L (ref 19–32)
Creatinine, Ser: 0.91 mg/dL (ref 0.40–1.20)
GFR: 75.27 mL/min (ref >60.00)
GLUCOSE: 89 mg/dL (ref 70–99)
POTASSIUM: 4.2 meq/L (ref 3.5–5.1)
Sodium: 139 mEq/L (ref 135–145)

## 2014-08-12 LAB — CBC WITH DIFFERENTIAL/PLATELET
BASOS PCT: 0.4 % (ref 0.0–3.0)
Basophils Absolute: 0 10*3/uL (ref 0.0–0.1)
Eosinophils Absolute: 0 10*3/uL (ref 0.0–0.7)
Eosinophils Relative: 0.2 % (ref 0.0–5.0)
HCT: 43.9 % (ref 36.0–46.0)
Hemoglobin: 14.5 g/dL (ref 12.0–15.0)
LYMPHS PCT: 18.3 % (ref 12.0–46.0)
Lymphs Abs: 1.6 10*3/uL (ref 0.7–4.0)
MCHC: 33 g/dL (ref 30.0–36.0)
MCV: 91.6 fl (ref 78.0–100.0)
MONO ABS: 0.6 10*3/uL (ref 0.1–1.0)
MONOS PCT: 6.7 % (ref 3.0–12.0)
NEUTROS ABS: 6.4 10*3/uL (ref 1.4–7.7)
NEUTROS PCT: 74.4 % (ref 43.0–77.0)
Platelets: 189 10*3/uL (ref 150.0–400.0)
RBC: 4.79 Mil/uL (ref 3.87–5.11)
RDW: 13.1 % (ref 11.5–15.5)
WBC: 8.6 10*3/uL (ref 4.0–10.5)

## 2014-08-12 LAB — HEPATIC FUNCTION PANEL
ALBUMIN: 4.3 g/dL (ref 3.5–5.2)
ALT: 7 U/L (ref 0–35)
AST: 14 U/L (ref 0–37)
Alkaline Phosphatase: 35 U/L — ABNORMAL LOW (ref 39–117)
Bilirubin, Direct: 0.1 mg/dL (ref 0.0–0.3)
TOTAL PROTEIN: 7.3 g/dL (ref 6.0–8.3)
Total Bilirubin: 0.5 mg/dL (ref 0.2–1.2)

## 2014-08-12 LAB — LIPID PANEL
Cholesterol: 134 mg/dL (ref 0–200)
HDL: 48.4 mg/dL (ref >39.00)
LDL CALC: 75 mg/dL (ref 0–99)
NonHDL: 85.6
TRIGLYCERIDES: 51 mg/dL (ref 0.0–149.0)
Total CHOL/HDL Ratio: 3
VLDL: 10.2 mg/dL (ref 0.0–40.0)

## 2014-08-12 LAB — VITAMIN D 25 HYDROXY (VIT D DEFICIENCY, FRACTURES): VITD: 41.27 ng/mL (ref 30.00–100.00)

## 2014-08-12 LAB — TSH: TSH: 2.05 u[IU]/mL (ref 0.35–4.50)

## 2014-08-12 NOTE — Assessment & Plan Note (Signed)
Pap collected. 

## 2014-08-12 NOTE — Assessment & Plan Note (Signed)
Pt's PE WNL.  Check labs.  Anticipatory guidance provided.  

## 2014-08-13 LAB — URINE CULTURE
Colony Count: NO GROWTH
Organism ID, Bacteria: NO GROWTH

## 2014-08-15 LAB — CYTOLOGY - PAP

## 2014-12-01 ENCOUNTER — Other Ambulatory Visit: Payer: Self-pay | Admitting: Family Medicine

## 2014-12-02 NOTE — Telephone Encounter (Signed)
Med filled.  

## 2015-01-15 ENCOUNTER — Other Ambulatory Visit: Payer: Self-pay | Admitting: Family Medicine

## 2015-01-16 NOTE — Telephone Encounter (Signed)
Med filled.  

## 2015-02-10 ENCOUNTER — Encounter: Payer: Self-pay | Admitting: Family Medicine

## 2015-02-10 ENCOUNTER — Ambulatory Visit (INDEPENDENT_AMBULATORY_CARE_PROVIDER_SITE_OTHER): Payer: BLUE CROSS/BLUE SHIELD | Admitting: Family Medicine

## 2015-02-10 VITALS — BP 110/80 | HR 98 | Temp 98.6°F | Resp 16 | Wt 150.5 lb

## 2015-02-10 DIAGNOSIS — R350 Frequency of micturition: Secondary | ICD-10-CM | POA: Diagnosis not present

## 2015-02-10 DIAGNOSIS — R319 Hematuria, unspecified: Secondary | ICD-10-CM | POA: Diagnosis not present

## 2015-02-10 LAB — POCT URINALYSIS DIPSTICK
BILIRUBIN UA: NEGATIVE
Blood, UA: NEGATIVE
Glucose, UA: NEGATIVE
Ketones, UA: NEGATIVE
Leukocytes, UA: NEGATIVE
NITRITE UA: NEGATIVE
PH UA: 6
Protein, UA: 0.15
Urobilinogen, UA: 0.2

## 2015-02-10 MED ORDER — CEPHALEXIN 500 MG PO CAPS: 500.0000 mg | ORAL_CAPSULE | Freq: Two times a day (BID) | ORAL | Status: DC

## 2015-02-10 NOTE — Progress Notes (Signed)
Pre visit review using our clinic review tool, if applicable. No additional management support is needed unless otherwise documented below in the visit note. 

## 2015-02-10 NOTE — Progress Notes (Signed)
   Subjective:    Patient ID: April Harper, female    DOB: 26-Apr-1981, 34 y.o.   MRN: [id number]  HPI Hematuria- pt reports sxs started on Monday w/ increased frequency.  Since then has had feeling of need to urinate but 'i don't'.  Blood on toilet tissue on Monday.  Now having L pelvic pain x2 days- pain ranging from 3-6/10.  LMP 7/1.  No fevers.  Hx of kidney stones.  No dysuria.   Review of Systems For ROS see HPI     Objective:   Physical Exam  Constitutional: She is oriented to person, place, and time. She appears well-developed and well-nourished. No distress.  HENT:  Head: Normocephalic and atraumatic.  Abdominal: Soft. Bowel sounds are normal. She exhibits no distension. There is no tenderness (no CVA, suprapubic, or pelvic tenderness). There is no rebound and no guarding.  Genitourinary:  Deferred at pt's request  Neurological: She is alert and oriented to person, place, and time.  Skin: Skin is warm and dry.  Vitals reviewed.         Assessment & Plan:

## 2015-02-10 NOTE — Patient Instructions (Signed)
Follow up as needed Increase your water intake Start the Keflex twice daily We'll notify you of your urine culture results and make any changes if needed The pain and increased discharge is consistent w/ ovulation Call with any questions or concerns- particularly if symptoms are not improving Have a great weekend!

## 2015-02-10 NOTE — Assessment & Plan Note (Signed)
New to provider.  Pt has sxs of UTI- despite normal UA- and also has hx of kidney stones.  No pain on exam today- making kidney stone less likely.  Bleeding on toilet tissue may actually have been spotting due to ovulation- which would also explain the pain (possible mittleschmirtz) and increased mucous vaginal d/c.  Will treat for possible UTI and monitor for sxs improvement while waiting on cx results.  If no improvement in sxs, will need additional evaluation.  Pt expressed understanding and is in agreement w/ plan.

## 2015-02-12 LAB — URINE CULTURE: Colony Count: >=100000

## 2015-02-14 ENCOUNTER — Telehealth: Payer: Self-pay | Admitting: Family Medicine

## 2015-02-14 NOTE — Telephone Encounter (Signed)
Called and notified pt, she expressed an understanding and will call if symptoms are still persistent after medications have been completed.

## 2015-02-14 NOTE — Telephone Encounter (Signed)
Drink plenty of fluids, finish meds as directed and if still symptomatic after a few days, come in for repeat UA (no OV needed)

## 2015-02-14 NOTE — Telephone Encounter (Signed)
Caller name:Leanette Relationship to patient:self  Can be reached: Pharmacy:  Reason for call:patient was in for a uti and got Keflex  She is on day 4 of the meds and is feeling some better but not completely better  Wanted to know if there was something else she needs to do

## 2015-09-07 ENCOUNTER — Other Ambulatory Visit: Payer: Self-pay | Admitting: Family Medicine

## 2015-09-07 NOTE — Telephone Encounter (Signed)
Medication filled to pharmacy as requested.   

## 2015-09-07 NOTE — Telephone Encounter (Signed)
Med denied, already filled today.

## 2015-10-31 DIAGNOSIS — F3181 Bipolar II disorder: Secondary | ICD-10-CM | POA: Diagnosis not present

## 2015-11-07 DIAGNOSIS — F3181 Bipolar II disorder: Secondary | ICD-10-CM | POA: Diagnosis not present

## 2015-11-14 DIAGNOSIS — F3181 Bipolar II disorder: Secondary | ICD-10-CM | POA: Diagnosis not present

## 2015-11-28 DIAGNOSIS — F3181 Bipolar II disorder: Secondary | ICD-10-CM | POA: Diagnosis not present

## 2015-12-12 DIAGNOSIS — F3181 Bipolar II disorder: Secondary | ICD-10-CM | POA: Diagnosis not present

## 2015-12-20 DIAGNOSIS — F3181 Bipolar II disorder: Secondary | ICD-10-CM | POA: Diagnosis not present

## 2016-01-02 DIAGNOSIS — F3181 Bipolar II disorder: Secondary | ICD-10-CM | POA: Diagnosis not present

## 2016-01-18 DIAGNOSIS — F3181 Bipolar II disorder: Secondary | ICD-10-CM | POA: Diagnosis not present

## 2016-01-19 ENCOUNTER — Ambulatory Visit (INDEPENDENT_AMBULATORY_CARE_PROVIDER_SITE_OTHER): Payer: BLUE CROSS/BLUE SHIELD | Admitting: Family Medicine

## 2016-01-19 ENCOUNTER — Encounter: Payer: Self-pay | Admitting: Family Medicine

## 2016-01-19 VITALS — BP 120/84 | HR 103 | Temp 98.3°F | Resp 17 | Ht 66.0 in | Wt 151.2 lb

## 2016-01-19 DIAGNOSIS — J302 Other seasonal allergic rhinitis: Secondary | ICD-10-CM

## 2016-01-19 DIAGNOSIS — R110 Nausea: Secondary | ICD-10-CM | POA: Diagnosis not present

## 2016-01-19 DIAGNOSIS — R55 Syncope and collapse: Secondary | ICD-10-CM | POA: Insufficient documentation

## 2016-01-19 MED ORDER — FLUTICASONE PROPIONATE 50 MCG/ACT NA SUSP: 2.0000 | Freq: Every day | NASAL | Status: DC

## 2016-01-19 MED ORDER — RANITIDINE HCL 300 MG PO TABS: 300.0000 mg | ORAL_TABLET | Freq: Every day | ORAL | Status: AC

## 2016-01-19 NOTE — Assessment & Plan Note (Signed)
Deteriorated.  Start Flonase in addition to Zyrtec.  Reviewed supportive care and red flags that should prompt return.  Pt expressed understanding and is in agreement w/ plan.

## 2016-01-19 NOTE — Progress Notes (Signed)
Pre visit review using our clinic review tool, if applicable. No additional management support is needed unless otherwise documented below in the visit note. 

## 2016-01-19 NOTE — Assessment & Plan Note (Signed)
New.  Suspect that this is due to pt's decreased fluid intake, standing w/ knees locked, and increased heat.  Reviewed dx and supportive care.  Will follow.

## 2016-01-19 NOTE — Assessment & Plan Note (Signed)
New.  Suspect that this is due to copious drainage and increased acid production.  Normal abd exam.  Start nasal steroid spray and acid reducer and monitor for symptom improvement.  Reviewed supportive care and red flags that should prompt return.  Pt expressed understanding and is in agreement w/ plan.

## 2016-01-19 NOTE — Patient Instructions (Signed)
Follow up as needed Continue the Zyrtec daily Add the nasal spray- 2 sprays each nostril daily Start the Ranitidine nightly Increase your fluid intake Avoid locking your legs while standing Call with any questions or concerns Have a great weekend!!

## 2016-01-19 NOTE — Progress Notes (Signed)
   Subjective:    Patient ID: April RutherfordLindsay Harper, female    DOB: 03/17/1981, 35 y.o.   MRN: [id number]  HPI Nausea- sxs started ~2 weeks ago.  + PND, denies congestion.  Had some swollen glands.  + fatigue, decreased appetite.  Had sensation of abdominal tightness w/ shooting pain on R side on 6/11- sxs resolved w/ tylenol and sleep.  Pt had a pre-syncopal episode while she was standing w/ legs locked.  Pt had sensation of heart skipping a beat and 'then it got back on track'.  + muscle aches.  No fevers.  Not drinking much water- 2 glasses/day.  Taking Zyrtec daily.  Not using nasal spray.  No known sick contacts.   Review of Systems For ROS see HPI     Objective:   Physical Exam  Constitutional: She is oriented to person, place, and time. She appears well-developed and well-nourished. No distress.  HENT:  Head: Normocephalic and atraumatic.  Right Ear: Tympanic membrane normal.  Left Ear: Tympanic membrane normal.  Nose: Mucosal edema and rhinorrhea present. Right sinus exhibits no maxillary sinus tenderness and no frontal sinus tenderness. Left sinus exhibits no maxillary sinus tenderness and no frontal sinus tenderness.  Mouth/Throat: Mucous membranes are normal. Posterior oropharyngeal erythema (w/ PND) present.  Eyes: Conjunctivae and EOM are normal. Pupils are equal, round, and reactive to light.  Neck: Normal range of motion. Neck supple.  Cardiovascular: Normal rate, regular rhythm and normal heart sounds.   Pulmonary/Chest: Effort normal and breath sounds normal. No respiratory distress. She has no wheezes. She has no rales.  Abdominal: Soft. Bowel sounds are normal. She exhibits no distension. There is tenderness (mild LUQ TTP). There is no rebound.  Lymphadenopathy:    She has no cervical adenopathy.  Neurological: She is alert and oriented to person, place, and time.  Skin: Skin is warm and dry.  Psychiatric: She has a normal mood and affect. Her behavior is normal. Thought  content normal.  Vitals reviewed.         Assessment & Plan:

## 2016-01-29 DIAGNOSIS — F3181 Bipolar II disorder: Secondary | ICD-10-CM | POA: Diagnosis not present

## 2016-02-06 DIAGNOSIS — F3181 Bipolar II disorder: Secondary | ICD-10-CM | POA: Diagnosis not present

## 2016-02-13 DIAGNOSIS — F3181 Bipolar II disorder: Secondary | ICD-10-CM | POA: Diagnosis not present

## 2016-02-20 DIAGNOSIS — F3181 Bipolar II disorder: Secondary | ICD-10-CM | POA: Diagnosis not present

## 2016-02-21 ENCOUNTER — Encounter: Payer: Self-pay | Admitting: Family Medicine

## 2016-02-21 ENCOUNTER — Ambulatory Visit (INDEPENDENT_AMBULATORY_CARE_PROVIDER_SITE_OTHER): Payer: BLUE CROSS/BLUE SHIELD | Admitting: Family Medicine

## 2016-02-21 VITALS — BP 122/80 | HR 91 | Temp 98.0°F | Resp 16 | Ht 66.0 in | Wt 149.0 lb

## 2016-02-21 DIAGNOSIS — F3130 Bipolar disorder, current episode depressed, mild or moderate severity, unspecified: Secondary | ICD-10-CM | POA: Diagnosis not present

## 2016-02-21 DIAGNOSIS — R5383 Other fatigue: Secondary | ICD-10-CM | POA: Diagnosis not present

## 2016-02-21 DIAGNOSIS — H269 Unspecified cataract: Secondary | ICD-10-CM

## 2016-02-21 LAB — CBC WITH DIFFERENTIAL/PLATELET
BASOS PCT: 0 %
Basophils Absolute: 0 cells/uL (ref 0–200)
EOS ABS: 0 {cells}/uL — AB (ref 15–500)
Eosinophils Relative: 0 %
HCT: 43.3 % (ref 35.0–45.0)
Hemoglobin: 14.7 g/dL (ref 11.7–15.5)
LYMPHS PCT: 21 %
Lymphs Abs: 1155 cells/uL (ref 850–3900)
MCH: 30.3 pg (ref 27.0–33.0)
MCHC: 33.9 g/dL (ref 32.0–36.0)
MCV: 89.3 fL (ref 80.0–100.0)
MONOS PCT: 9 %
MPV: 10.6 fL (ref 7.5–12.5)
Monocytes Absolute: 495 cells/uL (ref 200–950)
Neutro Abs: 3850 cells/uL (ref 1500–7800)
Neutrophils Relative %: 70 %
PLATELETS: 169 10*3/uL (ref 140–400)
RBC: 4.85 MIL/uL (ref 3.80–5.10)
RDW: 13.9 % (ref 11.0–15.0)
WBC: 5.5 10*3/uL (ref 3.8–10.8)

## 2016-02-21 LAB — BASIC METABOLIC PANEL
BUN: 13 mg/dL (ref 7–25)
CO2: 23 mmol/L (ref 20–31)
CREATININE: 0.87 mg/dL (ref 0.50–1.10)
Calcium: 9.5 mg/dL (ref 8.6–10.2)
Chloride: 105 mmol/L (ref 98–110)
Glucose, Bld: 84 mg/dL (ref 65–99)
Potassium: 4.1 mmol/L (ref 3.5–5.3)
Sodium: 140 mmol/L (ref 135–146)

## 2016-02-21 LAB — T3, FREE: T3, Free: 3.1 pg/mL (ref 2.3–4.2)

## 2016-02-21 LAB — VITAMIN B12: Vitamin B-12: 608 pg/mL (ref 200–1100)

## 2016-02-21 LAB — TSH: TSH: 1.63 m[IU]/L

## 2016-02-21 LAB — T4, FREE: Free T4: 1.2 ng/dL (ref 0.8–1.8)

## 2016-02-21 NOTE — Patient Instructions (Signed)
Follow up as needed We'll notify you of your lab results and make any changes if needed We'll call you with your eye appt Continue to work on healthy diet and regular exercise If you start to have hallucinations again- please let me know so we can proceed w/ MRI Call with any questions or concerns Hang in there!!!

## 2016-02-21 NOTE — Progress Notes (Signed)
Pre visit review using our clinic review tool, if applicable. No additional management support is needed unless otherwise documented below in the visit note. 

## 2016-02-21 NOTE — Progress Notes (Signed)
   Subjective:    Patient ID: April Harper, female    DOB: 1980-08-10, 35 y.o.   MRN: [id number]  HPI 'extremely tired'- sxs started ~3-4 weeks ago.  Despite sleeping 8-9 hrs/night is taking 1-1.5 hr naps ~5x/week.  Notes tremors in hands bilaterally when eating- typically occurs w/ intended movement.  Pt reports similar sxs when she is 'overmedicated on depakote'.  + sensitivity to light.  Intermittent double vision.  Increased stress levels recently b/c she has not had a job in 7 months.  Feels isolated after being terminated.  Pt reports 'visual hallucinations at night'- therapist suggested MRI but pt has not had sxs in the last few months.  Therapist yesterday recommended B12 and Vit D levels.  Pt has not had an eye exam recently but has known cataract in R eye.   Review of Systems For ROS see HPI     Objective:   Physical Exam  Constitutional: She is oriented to person, place, and time. She appears well-developed and well-nourished. No distress.  HENT:  Head: Normocephalic and atraumatic.  Eyes: Conjunctivae and EOM are normal. Pupils are equal, round, and reactive to light.  R cataract  Neck: Normal range of motion. Neck supple. No thyromegaly present.  Cardiovascular: Normal rate, regular rhythm, normal heart sounds and intact distal pulses.   No murmur heard. Pulmonary/Chest: Effort normal and breath sounds normal. No respiratory distress.  Abdominal: Soft. She exhibits no distension. There is no tenderness.  Musculoskeletal: She exhibits no edema.  Lymphadenopathy:    She has no cervical adenopathy.  Neurological: She is alert and oriented to person, place, and time. She has normal reflexes.  No tremor seen during visit  Skin: Skin is warm and dry.  Psychiatric: She has a normal mood and affect. Her behavior is normal.          Assessment & Plan:

## 2016-02-22 LAB — VITAMIN D 25 HYDROXY (VIT D DEFICIENCY, FRACTURES): Vit D, 25-Hydroxy: 57 ng/mL (ref 30–100)

## 2016-02-22 LAB — VALPROIC ACID LEVEL: Valproic Acid Lvl: 79.9 ug/mL (ref 50.0–100.0)

## 2016-02-25 NOTE — Assessment & Plan Note (Signed)
New to provider, ongoing for pt.  Suspect this is the cause of her intermittent double vision.  Needs referral for formal eye exam- referral placed.  Will follow.

## 2016-02-25 NOTE — Assessment & Plan Note (Signed)
Recurrent issue for pt.  I suspect this is again due to her ongoing depression but we will repeat labs to ensure there is nothing metabolic going on.  If labs are all normal, I suggested she follow up w/ psych to discuss adjusting medications.  Pt expressed understanding and is in agreement w/ plan.

## 2016-02-25 NOTE — Assessment & Plan Note (Signed)
Ongoing issue for pt.  Recently deteriorated due to loss of job.  Check depakote level.  Encouraged f/u w/ psych.  Pt expressed understanding and is in agreement w/ plan.

## 2016-02-26 DIAGNOSIS — F3181 Bipolar II disorder: Secondary | ICD-10-CM | POA: Diagnosis not present

## 2016-03-05 DIAGNOSIS — F3181 Bipolar II disorder: Secondary | ICD-10-CM | POA: Diagnosis not present

## 2016-03-12 DIAGNOSIS — F3181 Bipolar II disorder: Secondary | ICD-10-CM | POA: Diagnosis not present

## 2016-03-12 DIAGNOSIS — Q120 Congenital cataract: Secondary | ICD-10-CM | POA: Diagnosis not present

## 2016-03-19 DIAGNOSIS — F3181 Bipolar II disorder: Secondary | ICD-10-CM | POA: Diagnosis not present

## 2016-03-26 DIAGNOSIS — F3181 Bipolar II disorder: Secondary | ICD-10-CM | POA: Diagnosis not present

## 2016-04-03 DIAGNOSIS — F3181 Bipolar II disorder: Secondary | ICD-10-CM | POA: Diagnosis not present

## 2016-04-15 DIAGNOSIS — F3181 Bipolar II disorder: Secondary | ICD-10-CM | POA: Diagnosis not present

## 2016-04-19 ENCOUNTER — Ambulatory Visit (INDEPENDENT_AMBULATORY_CARE_PROVIDER_SITE_OTHER): Payer: BLUE CROSS/BLUE SHIELD | Admitting: Family Medicine

## 2016-04-19 ENCOUNTER — Encounter: Payer: Self-pay | Admitting: Family Medicine

## 2016-04-19 VITALS — BP 120/80 | HR 99 | Temp 98.0°F | Resp 16 | Ht 66.0 in | Wt 149.0 lb

## 2016-04-19 DIAGNOSIS — Z111 Encounter for screening for respiratory tuberculosis: Secondary | ICD-10-CM | POA: Diagnosis not present

## 2016-04-19 DIAGNOSIS — Z0000 Encounter for general adult medical examination without abnormal findings: Secondary | ICD-10-CM | POA: Diagnosis not present

## 2016-04-19 DIAGNOSIS — Z23 Encounter for immunization: Secondary | ICD-10-CM | POA: Diagnosis not present

## 2016-04-19 LAB — LIPID PANEL
Cholesterol: 140 mg/dL (ref 0–200)
HDL: 54.1 mg/dL (ref >39.00)
LDL Cholesterol: 73 mg/dL (ref 0–99)
NONHDL: 85.62
Total CHOL/HDL Ratio: 3
Triglycerides: 62 mg/dL (ref 0.0–149.0)
VLDL: 12.4 mg/dL (ref 0.0–40.0)

## 2016-04-19 LAB — HEPATIC FUNCTION PANEL
ALBUMIN: 4.3 g/dL (ref 3.5–5.2)
ALT: 7 U/L (ref 0–35)
AST: 12 U/L (ref 0–37)
Alkaline Phosphatase: 38 U/L — ABNORMAL LOW (ref 39–117)
BILIRUBIN TOTAL: 0.4 mg/dL (ref 0.2–1.2)
Bilirubin, Direct: 0.1 mg/dL (ref 0.0–0.3)
TOTAL PROTEIN: 6.9 g/dL (ref 6.0–8.3)

## 2016-04-19 LAB — CBC WITH DIFFERENTIAL/PLATELET
BASOS ABS: 0 10*3/uL (ref 0.0–0.1)
BASOS PCT: 0.4 % (ref 0.0–3.0)
EOS ABS: 0 10*3/uL (ref 0.0–0.7)
Eosinophils Relative: 0.5 % (ref 0.0–5.0)
HEMATOCRIT: 42.9 % (ref 36.0–46.0)
HEMOGLOBIN: 14.5 g/dL (ref 12.0–15.0)
LYMPHS PCT: 25.7 % (ref 12.0–46.0)
Lymphs Abs: 1.6 10*3/uL (ref 0.7–4.0)
MCHC: 33.9 g/dL (ref 30.0–36.0)
MCV: 90 fl (ref 78.0–100.0)
Monocytes Absolute: 0.5 10*3/uL (ref 0.1–1.0)
Monocytes Relative: 8.4 % (ref 3.0–12.0)
Neutro Abs: 4 10*3/uL (ref 1.4–7.7)
Neutrophils Relative %: 65 % (ref 43.0–77.0)
Platelets: 155 10*3/uL (ref 150.0–400.0)
RBC: 4.77 Mil/uL (ref 3.87–5.11)
RDW: 13.2 % (ref 11.5–15.5)
WBC: 6.2 10*3/uL (ref 4.0–10.5)

## 2016-04-19 LAB — BASIC METABOLIC PANEL
BUN: 18 mg/dL (ref 6–23)
CALCIUM: 9.2 mg/dL (ref 8.4–10.5)
CO2: 28 meq/L (ref 19–32)
CREATININE: 1 mg/dL (ref 0.40–1.20)
Chloride: 103 mEq/L (ref 96–112)
GFR: 66.84 mL/min (ref >60.00)
Glucose, Bld: 70 mg/dL (ref 70–99)
Potassium: 3.8 mEq/L (ref 3.5–5.1)
Sodium: 140 mEq/L (ref 135–145)

## 2016-04-19 LAB — TSH: TSH: 3.35 u[IU]/mL (ref 0.35–4.50)

## 2016-04-19 LAB — VITAMIN D 25 HYDROXY (VIT D DEFICIENCY, FRACTURES): VITD: 48.3 ng/mL (ref 30.00–100.00)

## 2016-04-19 NOTE — Progress Notes (Signed)
   Subjective:    Patient ID: April Harper, female    DOB: [birth year], 35 y.o.   MRN: [id number]  HPI CPE- UTD on pap.  Due for Tdap.  Starting online classes for Master's Degree.  Starting a new job next week as well.   Review of Systems Patient reports no hearing changes, adenopathy,fever, weight change,  persistant/recurrent hoarseness, swallowing issues, chest pain, palpitations, edema, persistant/recurrent cough, hemoptysis, dyspnea (rest/exertional/paroxysmal nocturnal), gastrointestinal bleeding (melena, rectal bleeding), abdominal pain, significant heartburn, bowel changes, GU symptoms (dysuria, hematuria, incontinence), Gyn symptoms (abnormal  bleeding, pain),  syncope, focal weakness, memory loss, numbness & tingling, skin/hair/nail changes, abnormal bruising or bleeding, anxiety, or depression.   + posterior cataract    Objective:   Physical Exam  General Appearance:    Alert, cooperative, no distress, appears stated age  Head:    Normocephalic, without obvious abnormality, atraumatic  Eyes:    PERRL, conjunctiva/corneas clear, EOM's intact, fundi    benign, both eyes  Ears:    Normal TM's and external ear canals, both ears  Nose:   Nares normal, septum midline, mucosa normal, no drainage    or sinus tenderness  Throat:   Lips, mucosa, and tongue normal; teeth and gums normal  Neck:   Supple, symmetrical, trachea midline, no adenopathy;    Thyroid: no enlargement/tenderness/nodules  Back:     Symmetric, no curvature, ROM normal, no CVA tenderness  Lungs:     Clear to auscultation bilaterally, respirations unlabored  Chest Wall:    No tenderness or deformity   Heart:    Regular rate and rhythm, S1 and S2 normal, no murmur, rub   or gallop  Breast Exam:    No tenderness, masses, or nipple abnormality  Abdomen:     Soft, non-tender, bowel sounds active all four quadrants,    no masses, no organomegaly  Genitalia:    Deferred at pt's request  Rectal:    Extremities:    Extremities normal, atraumatic, no cyanosis or edema  Pulses:   2+ and symmetric all extremities  Skin:   Skin color, texture, turgor normal, no rashes or lesions  Lymph nodes:   Cervical, supraclavicular, and axillary nodes normal  Neurologic:   CNII-XII intact, normal strength, sensation and reflexes    throughout          Assessment & Plan:

## 2016-04-19 NOTE — Addendum Note (Signed)
Addended by: Geannie RisenBRODMERKEL, Shakayla Hickox L on: 04/19/2016 10:49 AM   Modules accepted: Orders

## 2016-04-19 NOTE — Patient Instructions (Signed)
Follow up in 1 year or as needed We'll notify you of your lab results and make any changes if needed Continue to work on healthy diet and regular exercise- you look great!!! Call with any questions or concerns Good luck at the new job!!!

## 2016-04-19 NOTE — Assessment & Plan Note (Signed)
Pt's PE WNL.  UTD on pap.  Normal breast exam today.  Check labs.  Tdap given.  Anticipatory guidance provided.

## 2016-04-19 NOTE — Progress Notes (Signed)
Pre visit review using our clinic review tool, if applicable. No additional management support is needed unless otherwise documented below in the visit note. 

## 2016-04-22 LAB — QUANTIFERON TB GOLD ASSAY (BLOOD)
Interferon Gamma Release Assay: NEGATIVE
MITOGEN-NIL SO: 4.28 [IU]/mL
QUANTIFERON TB AG MINUS NIL: 0 [IU]/mL
Quantiferon Nil Value: 0.02 IU/mL

## 2016-04-23 DIAGNOSIS — F3181 Bipolar II disorder: Secondary | ICD-10-CM | POA: Diagnosis not present

## 2016-04-30 DIAGNOSIS — F3181 Bipolar II disorder: Secondary | ICD-10-CM | POA: Diagnosis not present

## 2016-05-07 DIAGNOSIS — F3181 Bipolar II disorder: Secondary | ICD-10-CM | POA: Diagnosis not present

## 2016-05-16 DIAGNOSIS — F3181 Bipolar II disorder: Secondary | ICD-10-CM | POA: Diagnosis not present

## 2016-05-21 DIAGNOSIS — F3181 Bipolar II disorder: Secondary | ICD-10-CM | POA: Diagnosis not present

## 2016-05-28 DIAGNOSIS — F3181 Bipolar II disorder: Secondary | ICD-10-CM | POA: Diagnosis not present

## 2016-06-06 DIAGNOSIS — F3181 Bipolar II disorder: Secondary | ICD-10-CM | POA: Diagnosis not present

## 2016-06-19 DIAGNOSIS — F3181 Bipolar II disorder: Secondary | ICD-10-CM | POA: Diagnosis not present

## 2016-07-02 DIAGNOSIS — F3181 Bipolar II disorder: Secondary | ICD-10-CM | POA: Diagnosis not present

## 2016-07-09 DIAGNOSIS — F3181 Bipolar II disorder: Secondary | ICD-10-CM | POA: Diagnosis not present

## 2016-07-16 DIAGNOSIS — F3181 Bipolar II disorder: Secondary | ICD-10-CM | POA: Diagnosis not present

## 2016-07-25 DIAGNOSIS — F3181 Bipolar II disorder: Secondary | ICD-10-CM | POA: Diagnosis not present

## 2016-08-30 DIAGNOSIS — F3181 Bipolar II disorder: Secondary | ICD-10-CM | POA: Diagnosis not present

## 2016-09-11 DIAGNOSIS — F3181 Bipolar II disorder: Secondary | ICD-10-CM | POA: Diagnosis not present

## 2016-09-17 DIAGNOSIS — F3181 Bipolar II disorder: Secondary | ICD-10-CM | POA: Diagnosis not present

## 2016-09-25 DIAGNOSIS — F3181 Bipolar II disorder: Secondary | ICD-10-CM | POA: Diagnosis not present

## 2016-10-09 DIAGNOSIS — F3181 Bipolar II disorder: Secondary | ICD-10-CM | POA: Diagnosis not present

## 2016-10-16 DIAGNOSIS — F3181 Bipolar II disorder: Secondary | ICD-10-CM | POA: Diagnosis not present

## 2016-10-30 DIAGNOSIS — F3181 Bipolar II disorder: Secondary | ICD-10-CM | POA: Diagnosis not present

## 2016-11-05 DIAGNOSIS — F3181 Bipolar II disorder: Secondary | ICD-10-CM | POA: Diagnosis not present

## 2016-11-12 DIAGNOSIS — F3181 Bipolar II disorder: Secondary | ICD-10-CM | POA: Diagnosis not present

## 2016-11-19 DIAGNOSIS — F3181 Bipolar II disorder: Secondary | ICD-10-CM | POA: Diagnosis not present

## 2016-11-26 DIAGNOSIS — F3181 Bipolar II disorder: Secondary | ICD-10-CM | POA: Diagnosis not present

## 2016-12-04 DIAGNOSIS — F3181 Bipolar II disorder: Secondary | ICD-10-CM | POA: Diagnosis not present

## 2016-12-11 DIAGNOSIS — F3181 Bipolar II disorder: Secondary | ICD-10-CM | POA: Diagnosis not present

## 2016-12-16 ENCOUNTER — Encounter: Payer: Self-pay | Admitting: Family Medicine

## 2016-12-16 ENCOUNTER — Ambulatory Visit (INDEPENDENT_AMBULATORY_CARE_PROVIDER_SITE_OTHER): Payer: BLUE CROSS/BLUE SHIELD | Admitting: Family Medicine

## 2016-12-16 VITALS — BP 118/82 | HR 113 | Temp 98.1°F | Resp 17 | Ht 66.0 in | Wt 150.5 lb

## 2016-12-16 DIAGNOSIS — R350 Frequency of micturition: Secondary | ICD-10-CM

## 2016-12-16 LAB — POCT URINALYSIS DIPSTICK
BILIRUBIN UA: NEGATIVE
Glucose, UA: NEGATIVE
Ketones, UA: NEGATIVE
LEUKOCYTES UA: NEGATIVE
NITRITE UA: NEGATIVE
PH UA: 7 (ref 5.0–8.0)
Protein, UA: NEGATIVE
Spec Grav, UA: 1.01 (ref 1.010–1.025)
Urobilinogen, UA: 0.2 E.U./dL

## 2016-12-16 MED ORDER — CEPHALEXIN 500 MG PO CAPS: 500.0000 mg | ORAL_CAPSULE | Freq: Two times a day (BID) | ORAL | 0 refills | Status: AC

## 2016-12-16 NOTE — Addendum Note (Signed)
Addended by: Lenis DickinsonILLARD, Kelsha Older M on: 12/16/2016 02:30 PM   Modules accepted: Orders

## 2016-12-16 NOTE — Patient Instructions (Signed)
Follow up as needed/scheduled We'll notify you of your urine culture results and make any changes if needed Start the Keflex twice daily for presumed infection Drink plenty of fluids Call with any questions or concerns Hang in there!!!

## 2016-12-16 NOTE — Progress Notes (Signed)
   Subjective:    Patient ID: April Harper, female    DOB: 1981-04-04, 36 y.o.   MRN: [id number]  HPI ? UTI- 'i feel the need to urinate a lot'.  sxs started ~3 weeks ago w/ 'a lot of pain on my right side'.  Increased bloating, frequency.  Intermittent LBP.  Menses ended yesterday.  + suprapubic pressure.  Some low back pressure.  No fevers.  Denies urgency or dysuria.   Review of Systems For ROS see HPI     Objective:   Physical Exam  Constitutional: She is oriented to person, place, and time. She appears well-developed and well-nourished. No distress.  Abdominal: Soft. She exhibits no distension. There is tenderness (mild suprapubic but no CVA tenderness ).  Neurological: She is alert and oriented to person, place, and time.  Skin: Skin is warm and dry.  Psychiatric: She has a normal mood and affect. Her behavior is normal. Thought content normal.  Vitals reviewed.         Assessment & Plan:  Urine frequency- new.  Pt's sxs and UA are consistent w/ infxn.  Start Keflex twice daily.  Drink plenty of fluids.  Reviewed supportive care and red flags that should prompt return.  Pt expressed understanding and is in agreement w/ plan.

## 2016-12-16 NOTE — Progress Notes (Signed)
Pre visit review using our clinic review tool, if applicable. No additional management support is needed unless otherwise documented below in the visit note. 

## 2016-12-17 DIAGNOSIS — F3181 Bipolar II disorder: Secondary | ICD-10-CM | POA: Diagnosis not present

## 2016-12-17 LAB — URINE CULTURE: ORGANISM ID, BACTERIA: NO GROWTH

## 2016-12-19 ENCOUNTER — Encounter: Payer: Self-pay | Admitting: Family Medicine

## 2016-12-24 DIAGNOSIS — F3181 Bipolar II disorder: Secondary | ICD-10-CM | POA: Diagnosis not present

## 2017-01-01 DIAGNOSIS — F3181 Bipolar II disorder: Secondary | ICD-10-CM | POA: Diagnosis not present

## 2017-01-14 DIAGNOSIS — F3181 Bipolar II disorder: Secondary | ICD-10-CM | POA: Diagnosis not present

## 2017-01-21 DIAGNOSIS — F3181 Bipolar II disorder: Secondary | ICD-10-CM | POA: Diagnosis not present

## 2017-01-28 DIAGNOSIS — F3181 Bipolar II disorder: Secondary | ICD-10-CM | POA: Diagnosis not present

## 2017-02-04 DIAGNOSIS — F3181 Bipolar II disorder: Secondary | ICD-10-CM | POA: Diagnosis not present

## 2017-02-11 DIAGNOSIS — F3181 Bipolar II disorder: Secondary | ICD-10-CM | POA: Diagnosis not present

## 2017-02-14 ENCOUNTER — Ambulatory Visit (INDEPENDENT_AMBULATORY_CARE_PROVIDER_SITE_OTHER): Payer: BLUE CROSS/BLUE SHIELD | Admitting: Family Medicine

## 2017-02-14 ENCOUNTER — Encounter: Payer: Self-pay | Admitting: Family Medicine

## 2017-02-14 VITALS — BP 120/80 | HR 106 | Temp 98.2°F | Resp 16 | Ht 66.0 in | Wt 156.1 lb

## 2017-02-14 DIAGNOSIS — M85612 Other cyst of bone, left shoulder: Secondary | ICD-10-CM | POA: Diagnosis not present

## 2017-02-14 DIAGNOSIS — IMO0002 Reserved for concepts with insufficient information to code with codable children: Secondary | ICD-10-CM

## 2017-02-14 MED ORDER — DOXYCYCLINE HYCLATE 100 MG PO TABS: 100.0000 mg | ORAL_TABLET | Freq: Two times a day (BID) | ORAL | 0 refills | Status: DC

## 2017-02-14 NOTE — Progress Notes (Signed)
   Subjective:    Patient ID: April Harper, female    DOB: [birth year], 36 y.o.   MRN: [id number]  HPI Cyst- started as a small 'pimple' on collar bone in mid-May.  Used Stridex w/ some improvement.  Late June became large and itchy.  July 12th was large, deep red, painful.  No drainage that pt is aware of.     Review of Systems For ROS see HPI     Objective:   Physical Exam  Constitutional: She appears well-developed and well-nourished. No distress.  HENT:  Head: Normocephalic and atraumatic.  Skin: Skin is warm and dry.  Pt w/ 2 cm area of fluctuance over L clavicle w/ minimal surrounding redness and mild TTP.  Area prepped w/ alcohol and incised w/ 15 blade after cold spray applied.  Copious amount of material expressed.  Pressure applied, bleeding stopped, and dressing applied.  Pt tolerated procedure well.  Vitals reviewed.         Assessment & Plan:  Infected cyst/abscess- new.  Pt reports size has fluctuated over the last few months and is again becoming enlarged and painful.  Area of fluctuance today so area was prepped and I&D'd.  Start Doxy.  Reviewed supportive care and red flags that should prompt return.  Pt expressed understanding and is in agreement w/ plan.

## 2017-02-14 NOTE — Progress Notes (Signed)
Pre visit review using our clinic review tool, if applicable. No additional management support is needed unless otherwise documented below in the visit note. 

## 2017-02-14 NOTE — Patient Instructions (Signed)
Follow up as needed Start the Doxycycline twice daily- take w/ food Keep area clean and pat dry- apply neosporin or antibiotic ointment when changing dressing (tomorrow a bandaid should be fine) Call with any questions or concerns If the area re-forms or doesn't resolve, let me know and we can refer to derm Have a great summer!

## 2017-02-18 DIAGNOSIS — F3181 Bipolar II disorder: Secondary | ICD-10-CM | POA: Diagnosis not present

## 2017-02-25 DIAGNOSIS — F3181 Bipolar II disorder: Secondary | ICD-10-CM | POA: Diagnosis not present

## 2017-03-04 DIAGNOSIS — F3181 Bipolar II disorder: Secondary | ICD-10-CM | POA: Diagnosis not present

## 2017-03-11 DIAGNOSIS — F3181 Bipolar II disorder: Secondary | ICD-10-CM | POA: Diagnosis not present

## 2017-03-18 DIAGNOSIS — F3181 Bipolar II disorder: Secondary | ICD-10-CM | POA: Diagnosis not present

## 2017-03-25 DIAGNOSIS — F3181 Bipolar II disorder: Secondary | ICD-10-CM | POA: Diagnosis not present

## 2017-04-01 DIAGNOSIS — F3181 Bipolar II disorder: Secondary | ICD-10-CM | POA: Diagnosis not present

## 2017-04-08 DIAGNOSIS — F3181 Bipolar II disorder: Secondary | ICD-10-CM | POA: Diagnosis not present

## 2017-04-14 DIAGNOSIS — F3181 Bipolar II disorder: Secondary | ICD-10-CM | POA: Diagnosis not present

## 2017-04-29 DIAGNOSIS — F3181 Bipolar II disorder: Secondary | ICD-10-CM | POA: Diagnosis not present

## 2017-05-06 DIAGNOSIS — F3181 Bipolar II disorder: Secondary | ICD-10-CM | POA: Diagnosis not present

## 2017-05-19 ENCOUNTER — Encounter: Payer: Self-pay | Admitting: Family Medicine

## 2017-05-20 DIAGNOSIS — F3181 Bipolar II disorder: Secondary | ICD-10-CM | POA: Diagnosis not present

## 2017-05-27 DIAGNOSIS — F3181 Bipolar II disorder: Secondary | ICD-10-CM | POA: Diagnosis not present

## 2017-06-10 DIAGNOSIS — F3181 Bipolar II disorder: Secondary | ICD-10-CM | POA: Diagnosis not present

## 2017-06-17 DIAGNOSIS — F3181 Bipolar II disorder: Secondary | ICD-10-CM | POA: Diagnosis not present

## 2017-06-24 DIAGNOSIS — F3181 Bipolar II disorder: Secondary | ICD-10-CM | POA: Diagnosis not present

## 2017-07-01 DIAGNOSIS — F3181 Bipolar II disorder: Secondary | ICD-10-CM | POA: Diagnosis not present

## 2017-07-15 DIAGNOSIS — F3181 Bipolar II disorder: Secondary | ICD-10-CM | POA: Diagnosis not present

## 2017-07-30 DIAGNOSIS — F3181 Bipolar II disorder: Secondary | ICD-10-CM | POA: Diagnosis not present

## 2017-08-05 DIAGNOSIS — F3181 Bipolar II disorder: Secondary | ICD-10-CM | POA: Diagnosis not present

## 2017-08-12 DIAGNOSIS — F3181 Bipolar II disorder: Secondary | ICD-10-CM | POA: Diagnosis not present

## 2017-08-26 DIAGNOSIS — F3181 Bipolar II disorder: Secondary | ICD-10-CM | POA: Diagnosis not present

## 2017-09-02 DIAGNOSIS — F3181 Bipolar II disorder: Secondary | ICD-10-CM | POA: Diagnosis not present

## 2017-09-09 DIAGNOSIS — F3181 Bipolar II disorder: Secondary | ICD-10-CM | POA: Diagnosis not present

## 2017-09-16 DIAGNOSIS — F3181 Bipolar II disorder: Secondary | ICD-10-CM | POA: Diagnosis not present

## 2017-09-23 DIAGNOSIS — F3181 Bipolar II disorder: Secondary | ICD-10-CM | POA: Diagnosis not present

## 2017-09-30 DIAGNOSIS — F3181 Bipolar II disorder: Secondary | ICD-10-CM | POA: Diagnosis not present

## 2017-10-07 DIAGNOSIS — F3181 Bipolar II disorder: Secondary | ICD-10-CM | POA: Diagnosis not present

## 2017-10-14 DIAGNOSIS — F3181 Bipolar II disorder: Secondary | ICD-10-CM | POA: Diagnosis not present

## 2017-10-28 DIAGNOSIS — F3181 Bipolar II disorder: Secondary | ICD-10-CM | POA: Diagnosis not present

## 2017-11-04 DIAGNOSIS — F3181 Bipolar II disorder: Secondary | ICD-10-CM | POA: Diagnosis not present

## 2017-11-18 DIAGNOSIS — F3181 Bipolar II disorder: Secondary | ICD-10-CM | POA: Diagnosis not present

## 2017-12-02 DIAGNOSIS — F3181 Bipolar II disorder: Secondary | ICD-10-CM | POA: Diagnosis not present

## 2017-12-09 DIAGNOSIS — F3181 Bipolar II disorder: Secondary | ICD-10-CM | POA: Diagnosis not present

## 2017-12-16 DIAGNOSIS — F3181 Bipolar II disorder: Secondary | ICD-10-CM | POA: Diagnosis not present

## 2017-12-23 DIAGNOSIS — F3181 Bipolar II disorder: Secondary | ICD-10-CM | POA: Diagnosis not present

## 2018-01-05 DIAGNOSIS — F3181 Bipolar II disorder: Secondary | ICD-10-CM | POA: Diagnosis not present

## 2018-01-12 ENCOUNTER — Ambulatory Visit (INDEPENDENT_AMBULATORY_CARE_PROVIDER_SITE_OTHER): Payer: BLUE CROSS/BLUE SHIELD | Admitting: Family Medicine

## 2018-01-12 ENCOUNTER — Encounter: Payer: Self-pay | Admitting: Family Medicine

## 2018-01-12 ENCOUNTER — Other Ambulatory Visit: Payer: Self-pay

## 2018-01-12 VITALS — BP 128/78 | HR 94 | Temp 97.8°F | Resp 16 | Ht 66.0 in | Wt 166.4 lb

## 2018-01-12 DIAGNOSIS — R5383 Other fatigue: Secondary | ICD-10-CM

## 2018-01-12 DIAGNOSIS — Z0000 Encounter for general adult medical examination without abnormal findings: Secondary | ICD-10-CM

## 2018-01-12 DIAGNOSIS — E559 Vitamin D deficiency, unspecified: Secondary | ICD-10-CM

## 2018-01-12 LAB — COMPREHENSIVE METABOLIC PANEL
ALBUMIN: 4.5 g/dL (ref 3.5–5.2)
ALK PHOS: 44 U/L (ref 39–117)
ALT: 10 U/L (ref 0–35)
AST: 12 U/L (ref 0–37)
BILIRUBIN TOTAL: 0.6 mg/dL (ref 0.2–1.2)
BUN: 15 mg/dL (ref 6–23)
CALCIUM: 9.5 mg/dL (ref 8.4–10.5)
CO2: 27 mEq/L (ref 19–32)
Chloride: 102 mEq/L (ref 96–112)
Creatinine, Ser: 0.92 mg/dL (ref 0.40–1.20)
GFR: 72.89 mL/min (ref >60.00)
GLUCOSE: 88 mg/dL (ref 70–99)
POTASSIUM: 3.9 meq/L (ref 3.5–5.1)
Sodium: 140 mEq/L (ref 135–145)
Total Protein: 7 g/dL (ref 6.0–8.3)

## 2018-01-12 LAB — CBC WITH DIFFERENTIAL/PLATELET
BASOS ABS: 0 10*3/uL (ref 0.0–0.1)
Basophils Relative: 0.4 % (ref 0.0–3.0)
EOS PCT: 1.2 % (ref 0.0–5.0)
Eosinophils Absolute: 0.1 10*3/uL (ref 0.0–0.7)
HEMATOCRIT: 44.1 % (ref 36.0–46.0)
HEMOGLOBIN: 15.1 g/dL — AB (ref 12.0–15.0)
LYMPHS ABS: 1.6 10*3/uL (ref 0.7–4.0)
LYMPHS PCT: 23.5 % (ref 12.0–46.0)
MCHC: 34.2 g/dL (ref 30.0–36.0)
MCV: 88.7 fl (ref 78.0–100.0)
MONOS PCT: 7.1 % (ref 3.0–12.0)
Monocytes Absolute: 0.5 10*3/uL (ref 0.1–1.0)
Neutro Abs: 4.5 10*3/uL (ref 1.4–7.7)
Neutrophils Relative %: 67.8 % (ref 43.0–77.0)
Platelets: 186 10*3/uL (ref 150.0–400.0)
RBC: 4.97 Mil/uL (ref 3.87–5.11)
RDW: 13.5 % (ref 11.5–15.5)
WBC: 6.7 10*3/uL (ref 4.0–10.5)

## 2018-01-12 LAB — LIPID PANEL
CHOLESTEROL: 173 mg/dL (ref 0–200)
HDL: 49.4 mg/dL (ref >39.00)
LDL Cholesterol: 100 mg/dL — ABNORMAL HIGH (ref 0–99)
NONHDL: 123.97
TRIGLYCERIDES: 122 mg/dL (ref 0.0–149.0)
Total CHOL/HDL Ratio: 4
VLDL: 24.4 mg/dL (ref 0.0–40.0)

## 2018-01-12 LAB — VITAMIN D 25 HYDROXY (VIT D DEFICIENCY, FRACTURES): VITD: 64.89 ng/mL (ref 30.00–100.00)

## 2018-01-12 LAB — TSH: TSH: 3.55 u[IU]/mL (ref 0.35–4.50)

## 2018-01-12 NOTE — Patient Instructions (Addendum)
Please return in 12 months for your annual complete physical; please come fasting. With Dr. Beverely Low.   Start flonase OTC daily to help control your allergies.   If you have any questions or concerns, please don't hesitate to send me a message via MyChart or call the office at [phone]. Thank you for visiting with [place] today! It's our pleasure caring for you.  Please do these things to maintain good health!   Exercise at least 30-45 minutes a day,  4-5 days a week.   Eat a low-fat diet with lots of fruits and vegetables, up to 7-9 servings per day.  Drink plenty of water daily. Try to drink 8 8oz glasses per day.  Seatbelts can save your life. Always wear your seatbelt.  Place Smoke Detectors on every level of your home and check batteries every year.  Schedule an appointment with an eye doctor for an eye exam every 1-2 years  Safe sex - use condoms to protect yourself from STDs if you could be exposed to these types of infections. Use birth control if you do not want to become pregnant and are sexually active.  Avoid heavy alcohol use. If you drink, keep it to less than 2 drinks/day and not every day.  Health Care Power of Attorney.  Choose someone you trust that could speak for you if you became unable to speak for yourself.  Depression is common in our stressful world.If you're feeling down or losing interest in things you normally enjoy, please come in for a visit.  If anyone is threatening or hurting you, please get help. Physical or Emotional Violence is never OK.     Fatigue Fatigue is feeling tired all of the time, a lack of energy, or a lack of motivation. Occasional or mild fatigue is often a normal response to activity or life in general. However, long-lasting (chronic) or extreme fatigue may indicate an underlying medical condition. Follow these instructions at home: Watch your fatigue for any changes. The following actions may help to lessen any discomfort you are  feeling:  Talk to your health care provider about how much sleep you need each night. Try to get the required amount every night.  Take medicines only as directed by your health care provider.  Eat a healthy and nutritious diet. Ask your health care provider if you need help changing your diet.  Drink enough fluid to keep your urine clear or pale yellow.  Practice ways of relaxing, such as yoga, meditation, massage therapy, or acupuncture.  Exercise regularly.  Change situations that cause you stress. Try to keep your work and personal routine reasonable.  Do not abuse illegal drugs.  Limit alcohol intake to no more than 1 drink per day for nonpregnant women and 2 drinks per day for men. One drink equals 12 ounces of beer, 5 ounces of wine, or 1 ounces of hard liquor.  Take a multivitamin, if directed by your health care provider.  Contact a health care provider if:  Your fatigue does not get better.  You have a fever.  You have unintentional weight loss or gain.  You have headaches.  You have difficulty: ? Falling asleep. ? Sleeping throughout the night.  You feel angry, guilty, anxious, or sad.  You are unable to have a bowel movement (constipation).  You skin is dry.  Your legs or another part of your body is swollen. Get help right away if:  You feel confused.  Your vision is blurry.  You feel faint or pass out.  You have a severe headache.  You have severe abdominal, pelvic, or back pain.  You have chest pain, shortness of breath, or an irregular or fast heartbeat.  You are unable to urinate or you urinate less than normal.  You develop abnormal bleeding, such as bleeding from the rectum, vagina, nose, lungs, or nipples.  You vomit blood.  You have thoughts about harming yourself or committing suicide.  You are worried that you might harm someone else. This information is not intended to replace advice given to you by your health care provider.  Make sure you discuss any questions you have with your health care provider. Document Released: 05/12/2007 Document Revised: 12/21/2015 Document Reviewed: 11/16/2013 Elsevier Interactive Patient Education  [employer]2018 [employer]

## 2018-01-12 NOTE — Progress Notes (Signed)
Subjective  Chief Complaint  Patient presents with  . Annual Exam    Patient is asking for specific blood work today, allergy issues    HPI: April Harper is a 37 y.o. female who presents to [facility] today for a Female Wellness Visit.   Wellness Visit: annual visit with health maintenance review and exam without Pap   Pleasant 37 year old patient of Dr. Beverely Harper here for complete physical without Pap today.  History of bipolar Harper managed by psychiatry.  Overall doing well however she lost her job in February.  She had mildly increased stress due to that.  She is pursuing a Neurosurgeon.  Her only concern is increasing fatigue levels.  May be related to stress.  Also has active allergies. Lifestyle: Body mass index is 26.86 kg/m. Wt Readings from Last 3 Encounters:  01/12/18 166 lb 6.4 oz (75.5 kg)  02/14/17 156 lb 2 oz (70.8 kg)  12/16/16 150 lb 8 oz (68.3 kg)   Diet: general Exercise: intermittently,  Need for contraception: No, abstinence, has regular periods.  Not heavy.  Last Pap 2016 with negative HPV testing  Patient Active Problem List   Diagnosis Date Noted  . Cataract, right eye 02/21/2016  . Nausea without vomiting 01/19/2016  . Pre-syncope 01/19/2016  . Hematuria 02/10/2015  . Physical exam 08/11/2014  . Pap smear for cervical cancer screening 08/11/2014  . UTI (urinary tract infection) 07/28/2014  . Allergic reaction 03/25/2014  . Fatigue 09/10/2013  . Dizzy spells 07/14/2012  . Allergic rhinitis 12/27/2011  . Tachycardia 12/27/2011  . Paresthesias 08/13/2011  . 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 There is white matter volume loss in  the occipital parietal white matter, especially on the left which  may be related to perinatal ischemia or hydrocephalous. No acute  abnormality.    . WEIGHT LOSS 09/27/2010    Qualifier: Diagnosis of  By: April Harper     . DIZZINESS  03/14/2010    Qualifier: Diagnosis of  By: April Harper.    . UNSPECIFIED VITAMIN D DEFICIENCY 10/30/2009    Qualifier: Diagnosis of  By: April Harper.    . UNSPECIFIED ESSENTIAL HYPERTENSION 10/30/2009    Qualifier: Diagnosis of  By: April Harper.    . URINARY CALCULUS 10/13/2007    Qualifier: Diagnosis of  By: April Harper, most recent episode depressed (HCC) 11/17/2006    Has associated high anxiety     Health Maintenance  Topic Date Due  . INFLUENZA VACCINE  02/26/2018  . PAP SMEAR  08/12/2019  . TETANUS/TDAP  04/19/2026  . HIV Screening  Completed   Immunization History  Administered Date(s) Administered  . Influenza Split 06/17/2012  . Td 06/29/2003  . Tdap 04/19/2016   We updated and reviewed the patient's past history in detail and it is documented below. Allergies: Patient has No Known Allergies. Past Medical History Patient  has a past medical history of Bipolar 2 Harper (HCC), Depression, Depressive Harper, not elsewhere classified, Dizziness and giddiness, Kidney stones, Obstructive hydrocephalus, Paresthesias, and Tachycardia. Past Surgical History Patient  has a past surgical history that includes Mandible fracture surgery (1998) and Tonsillectomy and adenoidectomy (2006). Family History: Patient family history includes COPD in her paternal grandmother; Cancer in her paternal grandfather; Emphysema in her paternal grandmother; Hypertension in her mother; Pneumonia (age of onset: 36) in her father. Social History:  Patient  reports that she has never smoked. She has never used smokeless tobacco. She reports that she does not drink alcohol or use drugs.  Review of Systems: Constitutional: negative for fever or malaise Ophthalmic: negative for photophobia, double vision or loss of vision Cardiovascular: negative for chest pain, dyspnea on exertion, or new LE swelling Respiratory: negative for SOB or persistent  cough Gastrointestinal: negative for abdominal pain, change in bowel habits or melena Genitourinary: negative for dysuria or gross hematuria, no abnormal uterine bleeding or disharge Musculoskeletal: negative for new gait disturbance or muscular weakness Integumentary: negative for new or persistent rashes, no breast lumps Neurological: negative for TIA or stroke symptoms Psychiatric: negative for SI or delusions Allergic/Immunologic: negative for hives Patient Care Team    Relationship Specialty Notifications Start End  April Hatchabori, Katherine E, MD PCP - General Family Medicine  09/27/13     Objective  Vitals: BP 128/78   Pulse 94   Temp 97.8 F (36.6 C) (Oral)   Resp 16   Ht 5\' 6"  (1.676 m)   Wt 166 lb 6.4 oz (75.5 kg)   SpO2 99%   BMI 26.86 kg/m  General:  Well developed, well nourished, no acute distress  Psych:  Alert and orientedx3,normal mood and affect HEENT:  Normocephalic, atraumatic, non-icteric sclera, PERRL, oropharynx is clear without mass or exudate, nasal mucosa is inflamed bilaterally, clear drainage.  Supple neck without adenopathy, mass or thyromegaly Cardiovascular:  Normal S1, S2, RRR without gallop, rub or murmur, nondisplaced PMI Respiratory:  Good breath sounds bilaterally, CTAB with normal respiratory effort Gastrointestinal: normal bowel sounds, soft, non-tender, no noted masses. No HSM MSK: no deformities, contusions. Joints are without erythema or swelling. Spine and CVA region are nontender Skin:  Warm, no rashes or suspicious lesions noted Neurologic:    Mental status is normal. CN 2-11 are normal. Gross motor and sensory exams are normal. Normal gait. No tremor Breast Exam: No mass, skin retraction or nipple discharge is appreciated in either breast. No axillary adenopathy. Fibrocystic changes are not noted   Assessment  1. Annual physical exam   2. Fatigue, unspecified type   3. Vitamin D deficiency      Plan  Female Wellness Visit:  Age  appropriate Health Maintenance and Prevention measures were discussed with patient. Included topics are cancer screening recommendations, ways to keep healthy (see AVS) including dietary and exercise recommendations, regular eye and dental care, use of seat belts, and avoidance of moderate alcohol use and tobacco use.   BMI: discussed patient's BMI and encouraged positive lifestyle modifications to help get to or maintain a target BMI.  HM needs and immunizations were addressed and ordered. See below for orders. See HM and immunization section for updates.  Routine labs and screening tests ordered including cmp, cbc and lipids where appropriate.  Discussed recommendations regarding Vit D and calcium supplementation (see AVS)  Recommend good sleep hygiene, good nutrition and rest, treating of active allergies by adding Flonase and checking lab work.  Follow up: Return in about 1 year (around 01/13/2019) for complete physical.    Commons side effects, risks, benefits, and alternatives for medications and treatment plan prescribed today were discussed, and the patient expressed understanding of the given instructions. Patient is instructed to call or message via MyChart if he/she has any questions or concerns regarding our treatment plan. No barriers to understanding were identified. We discussed Red Flag symptoms and signs in detail. Patient expressed understanding regarding what to do in case of  urgent or emergency type symptoms.   Medication list was reconciled, printed and provided to the patient in AVS. Patient instructions and summary information was reviewed with the patient as documented in the AVS. This note was prepared with assistance of Dragon voice recognition software. Occasional wrong-word or sound-a-like substitutions may have occurred due to the inherent limitations of voice recognition software  Orders Placed This Encounter  Procedures  . CBC with Differential/Platelet  .  Comprehensive metabolic panel  . Lipid panel  . TSH  . VITAMIN D 25 Hydroxy (Vit-D Deficiency, Fractures)   No orders of the defined types were placed in this encounter.

## 2018-01-13 DIAGNOSIS — F3181 Bipolar II disorder: Secondary | ICD-10-CM | POA: Diagnosis not present

## 2018-01-20 DIAGNOSIS — F3181 Bipolar II disorder: Secondary | ICD-10-CM | POA: Diagnosis not present

## 2018-01-27 DIAGNOSIS — F3181 Bipolar II disorder: Secondary | ICD-10-CM | POA: Diagnosis not present

## 2018-02-06 DIAGNOSIS — F3181 Bipolar II disorder: Secondary | ICD-10-CM | POA: Diagnosis not present

## 2018-02-17 ENCOUNTER — Ambulatory Visit: Payer: BLUE CROSS/BLUE SHIELD | Admitting: Family Medicine

## 2018-02-17 ENCOUNTER — Other Ambulatory Visit: Payer: Self-pay

## 2018-02-17 ENCOUNTER — Encounter: Payer: Self-pay | Admitting: Family Medicine

## 2018-02-17 VITALS — BP 110/74 | HR 86 | Temp 98.2°F | Ht 66.0 in | Wt 169.4 lb

## 2018-02-17 DIAGNOSIS — H019 Unspecified inflammation of eyelid: Secondary | ICD-10-CM

## 2018-02-17 DIAGNOSIS — F3181 Bipolar II disorder: Secondary | ICD-10-CM | POA: Diagnosis not present

## 2018-02-17 MED ORDER — CEPHALEXIN 500 MG PO CAPS: 500.0000 mg | ORAL_CAPSULE | Freq: Two times a day (BID) | ORAL | 0 refills | Status: AC

## 2018-02-17 NOTE — Patient Instructions (Signed)
Please follow up if symptoms do not improve or as needed.   Complete the antibiotics. advil if needed for soreness.  Return if worsens or if fever develops or if eye pain.

## 2018-02-17 NOTE — Progress Notes (Signed)
Subjective  CC:  Chief Complaint  Patient presents with  . Eye Pain    Left eye swelling, pain with touching  eye lid since this morning    HPI: April Harper is a 38 y.o. female who presents to the office today to address the problems listed above in the chief complaint.  Awoke with red sore swollen left upper eyelid w/o drainage, eye redness, crusting on eye lashes, visual disturbance. No f/c/s. No trauma. Maybe has rubbing her eye more that usual.    Assessment  1. Infection of eyelid      Plan   Eyelid infection:  Treat with warm compresses and abx. Monitor for worsening sxs. At risk for cellulitis.   Follow up: Return if symptoms worsen or fail to improve.   No orders of the defined types were placed in this encounter.  Meds ordered this encounter  Medications  . cephALEXin (KEFLEX) 500 MG capsule    Sig: Take 1 capsule (500 mg total) by mouth 2 (two) times daily.    Dispense:  14 capsule    Refill:  0      I reviewed the patients updated PMH, FH, and SocHx.    Patient Active Problem List   Diagnosis Date Noted  . Cataract, right eye 02/21/2016  . Nausea without vomiting 01/19/2016  . Pre-syncope 01/19/2016  . Hematuria 02/10/2015  . Physical exam 08/11/2014  . Pap smear for cervical cancer screening 08/11/2014  . UTI (urinary tract infection) 07/28/2014  . Allergic reaction 03/25/2014  . Fatigue 09/10/2013  . Dizzy spells 07/14/2012  . Allergic rhinitis 12/27/2011  . Tachycardia 12/27/2011  . Paresthesias 08/13/2011  . HYDROCEPHALUS 09/27/2010  . WEIGHT LOSS 09/27/2010  . DIZZINESS 03/14/2010  . UNSPECIFIED VITAMIN D DEFICIENCY 10/30/2009  . UNSPECIFIED ESSENTIAL HYPERTENSION 10/30/2009  . URINARY CALCULUS 10/13/2007  . Bipolar I disorder, most recent episode depressed (HCC) 11/17/2006   Current Meds  Medication Sig  . buPROPion (WELLBUTRIN XL) 300 MG 24 hr tablet Take 300 mg by mouth daily.   Marland Kitchen buPROPion (WELLBUTRIN) 75 MG tablet TAKE 1 TABLET  BY MOUTH DAILY WITH 300 MG TABLET  . cetirizine (ZYRTEC) 10 MG tablet Take 10 mg by mouth as needed.   . Cholecalciferol (VITAMIN D3) 2000 UNITS capsule Take 2,000 Units by mouth daily.  . divalproex (DEPAKOTE) 250 MG DR tablet Take 500 mg by mouth at bedtime.  Marland Kitchen FLUoxetine (PROZAC) 10 MG capsule   . hydrOXYzine (ATARAX/VISTARIL) 25 MG tablet Take 25 mg by mouth daily as needed.  Marland Kitchen LORazepam (ATIVAN) 0.5 MG tablet Take 0.5 mg by mouth as needed.  . Multiple Vitamin (MULTIVITAMIN) tablet Take 1 tablet by mouth daily.  . ranitidine (ZANTAC) 300 MG tablet Take 1 tablet (300 mg total) by mouth at bedtime. (Patient taking differently: Take 300 mg by mouth as needed. )  . [DISCONTINUED] metoprolol succinate (TOPROL-XL) 25 MG 24 hr tablet TAKE 1 TABLET (25 MG TOTAL) BY MOUTH DAILY.    Allergies: Patient has No Known Allergies. Family History: Patient family history includes COPD in her paternal grandmother; Cancer in her paternal grandfather; Emphysema in her paternal grandmother; Hypertension in her mother; Pneumonia (age of onset: 27) in her father. Social History:  Patient  reports that she has never smoked. She has never used smokeless tobacco. She reports that she does not drink alcohol or use drugs.  Review of Systems: Constitutional: Negative for fever malaise or anorexia Cardiovascular: negative for chest pain Respiratory: negative for SOB or  persistent cough Gastrointestinal: negative for abdominal pain  Objective  Vitals: BP 110/74   Pulse 86   Temp 98.2 F (36.8 C)   Ht 5\' 6"  (1.676 m)   Wt 169 lb 6.4 oz (76.8 kg)   LMP 01/30/2018   SpO2 98%   BMI 27.34 kg/m  General: no acute distress , A&Ox3 HEENT: PEERL, conjunctiva normal bilaterally, left upper eyelid is minimally red and swollen w/o obvious stye, no crusting, clear eye lashes, Oropharynx moist,neck is supple      Commons side effects, risks, benefits, and alternatives for medications and treatment plan prescribed  today were discussed, and the patient expressed understanding of the given instructions. Patient is instructed to call or message via MyChart if he/she has any questions or concerns regarding our treatment plan. No barriers to understanding were identified. We discussed Red Flag symptoms and signs in detail. Patient expressed understanding regarding what to do in case of urgent or emergency type symptoms.   Medication list was reconciled, printed and provided to the patient in AVS. Patient instructions and summary information was reviewed with the patient as documented in the AVS. This note was prepared with assistance of Dragon voice recognition software. Occasional wrong-word or sound-a-like substitutions may have occurred due to the inherent limitations of voice recognition software

## 2018-02-24 DIAGNOSIS — F3181 Bipolar II disorder: Secondary | ICD-10-CM | POA: Diagnosis not present

## 2018-02-25 DIAGNOSIS — H01021 Squamous blepharitis right upper eyelid: Secondary | ICD-10-CM | POA: Diagnosis not present

## 2018-02-25 DIAGNOSIS — H5372 Impaired contrast sensitivity: Secondary | ICD-10-CM | POA: Diagnosis not present

## 2018-02-25 DIAGNOSIS — H01022 Squamous blepharitis right lower eyelid: Secondary | ICD-10-CM | POA: Diagnosis not present

## 2018-02-25 DIAGNOSIS — H01025 Squamous blepharitis left lower eyelid: Secondary | ICD-10-CM | POA: Diagnosis not present

## 2018-02-25 DIAGNOSIS — Q120 Congenital cataract: Secondary | ICD-10-CM | POA: Diagnosis not present

## 2018-03-02 DIAGNOSIS — F3181 Bipolar II disorder: Secondary | ICD-10-CM | POA: Diagnosis not present

## 2018-03-04 DIAGNOSIS — H52212 Irregular astigmatism, left eye: Secondary | ICD-10-CM | POA: Diagnosis not present

## 2018-03-04 DIAGNOSIS — Q120 Congenital cataract: Secondary | ICD-10-CM | POA: Diagnosis not present

## 2018-03-04 DIAGNOSIS — H01021 Squamous blepharitis right upper eyelid: Secondary | ICD-10-CM | POA: Diagnosis not present

## 2018-03-18 DIAGNOSIS — F3181 Bipolar II disorder: Secondary | ICD-10-CM | POA: Diagnosis not present

## 2018-03-24 DIAGNOSIS — H18602 Keratoconus, unspecified, left eye: Secondary | ICD-10-CM | POA: Diagnosis not present

## 2018-03-27 DIAGNOSIS — F3181 Bipolar II disorder: Secondary | ICD-10-CM | POA: Diagnosis not present

## 2018-04-03 DIAGNOSIS — F3181 Bipolar II disorder: Secondary | ICD-10-CM | POA: Diagnosis not present

## 2018-04-09 DIAGNOSIS — F3181 Bipolar II disorder: Secondary | ICD-10-CM | POA: Diagnosis not present

## 2018-04-27 DIAGNOSIS — F3181 Bipolar II disorder: Secondary | ICD-10-CM | POA: Diagnosis not present

## 2018-05-07 DIAGNOSIS — F3181 Bipolar II disorder: Secondary | ICD-10-CM | POA: Diagnosis not present

## 2018-05-14 DIAGNOSIS — F3181 Bipolar II disorder: Secondary | ICD-10-CM | POA: Diagnosis not present

## 2018-05-26 DIAGNOSIS — F3181 Bipolar II disorder: Secondary | ICD-10-CM | POA: Diagnosis not present

## 2018-06-03 DIAGNOSIS — H01021 Squamous blepharitis right upper eyelid: Secondary | ICD-10-CM | POA: Diagnosis not present

## 2018-06-03 DIAGNOSIS — H52212 Irregular astigmatism, left eye: Secondary | ICD-10-CM | POA: Diagnosis not present

## 2018-06-03 DIAGNOSIS — Q120 Congenital cataract: Secondary | ICD-10-CM | POA: Diagnosis not present

## 2018-06-17 DIAGNOSIS — F3181 Bipolar II disorder: Secondary | ICD-10-CM | POA: Diagnosis not present

## 2018-06-29 DIAGNOSIS — F3181 Bipolar II disorder: Secondary | ICD-10-CM | POA: Diagnosis not present

## 2018-06-30 DIAGNOSIS — F3181 Bipolar II disorder: Secondary | ICD-10-CM | POA: Diagnosis not present

## 2018-07-14 DIAGNOSIS — F3181 Bipolar II disorder: Secondary | ICD-10-CM | POA: Diagnosis not present

## 2018-08-11 DIAGNOSIS — F3181 Bipolar II disorder: Secondary | ICD-10-CM | POA: Diagnosis not present

## 2018-08-18 DIAGNOSIS — F3181 Bipolar II disorder: Secondary | ICD-10-CM | POA: Diagnosis not present

## 2018-08-25 DIAGNOSIS — F3181 Bipolar II disorder: Secondary | ICD-10-CM | POA: Diagnosis not present

## 2018-09-01 DIAGNOSIS — F3181 Bipolar II disorder: Secondary | ICD-10-CM | POA: Diagnosis not present

## 2018-09-15 DIAGNOSIS — F3181 Bipolar II disorder: Secondary | ICD-10-CM | POA: Diagnosis not present

## 2018-09-29 DIAGNOSIS — F3181 Bipolar II disorder: Secondary | ICD-10-CM | POA: Diagnosis not present

## 2018-10-06 DIAGNOSIS — F3181 Bipolar II disorder: Secondary | ICD-10-CM | POA: Diagnosis not present

## 2018-11-10 DIAGNOSIS — F3181 Bipolar II disorder: Secondary | ICD-10-CM | POA: Diagnosis not present

## 2018-11-25 DIAGNOSIS — F3181 Bipolar II disorder: Secondary | ICD-10-CM | POA: Diagnosis not present

## 2018-12-10 DIAGNOSIS — F3181 Bipolar II disorder: Secondary | ICD-10-CM | POA: Diagnosis not present

## 2018-12-24 DIAGNOSIS — F3181 Bipolar II disorder: Secondary | ICD-10-CM | POA: Diagnosis not present

## 2018-12-31 DIAGNOSIS — F3181 Bipolar II disorder: Secondary | ICD-10-CM | POA: Diagnosis not present

## 2019-01-11 DIAGNOSIS — F3181 Bipolar II disorder: Secondary | ICD-10-CM | POA: Diagnosis not present

## 2019-01-22 DIAGNOSIS — F3181 Bipolar II disorder: Secondary | ICD-10-CM | POA: Diagnosis not present

## 2019-02-01 DIAGNOSIS — F3181 Bipolar II disorder: Secondary | ICD-10-CM | POA: Diagnosis not present

## 2019-02-08 DIAGNOSIS — F3181 Bipolar II disorder: Secondary | ICD-10-CM | POA: Diagnosis not present

## 2019-02-17 DIAGNOSIS — F3181 Bipolar II disorder: Secondary | ICD-10-CM | POA: Diagnosis not present

## 2019-03-09 DIAGNOSIS — F3181 Bipolar II disorder: Secondary | ICD-10-CM | POA: Diagnosis not present

## 2019-03-24 DIAGNOSIS — F3181 Bipolar II disorder: Secondary | ICD-10-CM | POA: Diagnosis not present

## 2019-03-30 DIAGNOSIS — F3181 Bipolar II disorder: Secondary | ICD-10-CM | POA: Diagnosis not present

## 2019-04-13 DIAGNOSIS — F3181 Bipolar II disorder: Secondary | ICD-10-CM | POA: Diagnosis not present

## 2019-04-28 DIAGNOSIS — F3181 Bipolar II disorder: Secondary | ICD-10-CM | POA: Diagnosis not present

## 2019-05-11 DIAGNOSIS — F3181 Bipolar II disorder: Secondary | ICD-10-CM | POA: Diagnosis not present

## 2019-05-25 DIAGNOSIS — F3181 Bipolar II disorder: Secondary | ICD-10-CM | POA: Diagnosis not present

## 2019-06-08 DIAGNOSIS — F3181 Bipolar II disorder: Secondary | ICD-10-CM | POA: Diagnosis not present

## 2019-06-21 DIAGNOSIS — F3181 Bipolar II disorder: Secondary | ICD-10-CM | POA: Diagnosis not present

## 2019-07-06 DIAGNOSIS — F3181 Bipolar II disorder: Secondary | ICD-10-CM | POA: Diagnosis not present

## 2019-07-20 DIAGNOSIS — F3181 Bipolar II disorder: Secondary | ICD-10-CM | POA: Diagnosis not present

## 2019-08-10 DIAGNOSIS — F3181 Bipolar II disorder: Secondary | ICD-10-CM | POA: Diagnosis not present

## 2019-08-24 DIAGNOSIS — F3181 Bipolar II disorder: Secondary | ICD-10-CM | POA: Diagnosis not present

## 2019-09-08 DIAGNOSIS — F3181 Bipolar II disorder: Secondary | ICD-10-CM | POA: Diagnosis not present

## 2019-09-21 DIAGNOSIS — F3181 Bipolar II disorder: Secondary | ICD-10-CM | POA: Diagnosis not present

## 2019-10-01 DIAGNOSIS — Z03818 Encounter for observation for suspected exposure to other biological agents ruled out: Secondary | ICD-10-CM | POA: Diagnosis not present

## 2019-10-01 DIAGNOSIS — Z20828 Contact with and (suspected) exposure to other viral communicable diseases: Secondary | ICD-10-CM | POA: Diagnosis not present

## 2019-10-05 DIAGNOSIS — F3181 Bipolar II disorder: Secondary | ICD-10-CM | POA: Diagnosis not present

## 2019-10-14 DIAGNOSIS — Z79899 Other long term (current) drug therapy: Secondary | ICD-10-CM | POA: Diagnosis not present

## 2019-10-14 DIAGNOSIS — F3181 Bipolar II disorder: Secondary | ICD-10-CM | POA: Diagnosis not present

## 2019-10-14 DIAGNOSIS — F411 Generalized anxiety disorder: Secondary | ICD-10-CM | POA: Diagnosis not present

## 2019-10-19 DIAGNOSIS — F3181 Bipolar II disorder: Secondary | ICD-10-CM | POA: Diagnosis not present

## 2019-10-25 DIAGNOSIS — F411 Generalized anxiety disorder: Secondary | ICD-10-CM | POA: Diagnosis not present

## 2019-10-25 DIAGNOSIS — F3181 Bipolar II disorder: Secondary | ICD-10-CM | POA: Diagnosis not present

## 2019-10-25 DIAGNOSIS — Z79899 Other long term (current) drug therapy: Secondary | ICD-10-CM | POA: Diagnosis not present

## 2019-11-04 DIAGNOSIS — F3181 Bipolar II disorder: Secondary | ICD-10-CM | POA: Diagnosis not present

## 2019-11-16 DIAGNOSIS — F411 Generalized anxiety disorder: Secondary | ICD-10-CM | POA: Diagnosis not present

## 2019-11-16 DIAGNOSIS — F3181 Bipolar II disorder: Secondary | ICD-10-CM | POA: Diagnosis not present

## 2019-11-23 DIAGNOSIS — F3181 Bipolar II disorder: Secondary | ICD-10-CM | POA: Diagnosis not present

## 2019-11-30 DIAGNOSIS — Z79899 Other long term (current) drug therapy: Secondary | ICD-10-CM | POA: Diagnosis not present

## 2019-11-30 DIAGNOSIS — F411 Generalized anxiety disorder: Secondary | ICD-10-CM | POA: Diagnosis not present

## 2019-11-30 DIAGNOSIS — F3181 Bipolar II disorder: Secondary | ICD-10-CM | POA: Diagnosis not present

## 2019-12-03 DIAGNOSIS — Z79899 Other long term (current) drug therapy: Secondary | ICD-10-CM | POA: Diagnosis not present

## 2019-12-03 DIAGNOSIS — F3181 Bipolar II disorder: Secondary | ICD-10-CM | POA: Diagnosis not present

## 2019-12-03 DIAGNOSIS — F411 Generalized anxiety disorder: Secondary | ICD-10-CM | POA: Diagnosis not present

## 2019-12-07 DIAGNOSIS — F3181 Bipolar II disorder: Secondary | ICD-10-CM | POA: Diagnosis not present

## 2019-12-13 DIAGNOSIS — F411 Generalized anxiety disorder: Secondary | ICD-10-CM | POA: Diagnosis not present

## 2019-12-13 DIAGNOSIS — F3181 Bipolar II disorder: Secondary | ICD-10-CM | POA: Diagnosis not present

## 2019-12-21 DIAGNOSIS — F3181 Bipolar II disorder: Secondary | ICD-10-CM | POA: Diagnosis not present

## 2020-01-07 DIAGNOSIS — F3181 Bipolar II disorder: Secondary | ICD-10-CM | POA: Diagnosis not present

## 2020-01-13 DIAGNOSIS — F411 Generalized anxiety disorder: Secondary | ICD-10-CM | POA: Diagnosis not present

## 2020-01-13 DIAGNOSIS — F3181 Bipolar II disorder: Secondary | ICD-10-CM | POA: Diagnosis not present

## 2020-01-14 DIAGNOSIS — F3181 Bipolar II disorder: Secondary | ICD-10-CM | POA: Diagnosis not present

## 2020-01-26 DIAGNOSIS — F3181 Bipolar II disorder: Secondary | ICD-10-CM | POA: Diagnosis not present

## 2020-02-15 DIAGNOSIS — F3181 Bipolar II disorder: Secondary | ICD-10-CM | POA: Diagnosis not present

## 2020-02-28 DIAGNOSIS — F3181 Bipolar II disorder: Secondary | ICD-10-CM | POA: Diagnosis not present

## 2020-03-14 DIAGNOSIS — F3181 Bipolar II disorder: Secondary | ICD-10-CM | POA: Diagnosis not present

## 2020-03-14 DIAGNOSIS — F411 Generalized anxiety disorder: Secondary | ICD-10-CM | POA: Diagnosis not present

## 2020-03-16 DIAGNOSIS — F3181 Bipolar II disorder: Secondary | ICD-10-CM | POA: Diagnosis not present

## 2020-03-23 DIAGNOSIS — Z20822 Contact with and (suspected) exposure to covid-19: Secondary | ICD-10-CM | POA: Diagnosis not present

## 2020-03-25 DIAGNOSIS — Z20822 Contact with and (suspected) exposure to covid-19: Secondary | ICD-10-CM | POA: Diagnosis not present

## 2020-03-30 DIAGNOSIS — F3181 Bipolar II disorder: Secondary | ICD-10-CM | POA: Diagnosis not present

## 2020-04-04 DIAGNOSIS — F3181 Bipolar II disorder: Secondary | ICD-10-CM | POA: Diagnosis not present

## 2020-04-19 DIAGNOSIS — F3181 Bipolar II disorder: Secondary | ICD-10-CM | POA: Diagnosis not present

## 2020-04-24 DIAGNOSIS — Z79899 Other long term (current) drug therapy: Secondary | ICD-10-CM | POA: Diagnosis not present

## 2020-04-24 DIAGNOSIS — F411 Generalized anxiety disorder: Secondary | ICD-10-CM | POA: Diagnosis not present

## 2020-04-24 DIAGNOSIS — F3181 Bipolar II disorder: Secondary | ICD-10-CM | POA: Diagnosis not present

## 2020-05-03 DIAGNOSIS — F3181 Bipolar II disorder: Secondary | ICD-10-CM | POA: Diagnosis not present

## 2020-05-04 DIAGNOSIS — F3181 Bipolar II disorder: Secondary | ICD-10-CM | POA: Diagnosis not present

## 2020-05-04 DIAGNOSIS — Z79899 Other long term (current) drug therapy: Secondary | ICD-10-CM | POA: Diagnosis not present

## 2020-05-04 DIAGNOSIS — F411 Generalized anxiety disorder: Secondary | ICD-10-CM | POA: Diagnosis not present

## 2020-05-18 DIAGNOSIS — F3181 Bipolar II disorder: Secondary | ICD-10-CM | POA: Diagnosis not present

## 2020-05-25 DIAGNOSIS — F3181 Bipolar II disorder: Secondary | ICD-10-CM | POA: Diagnosis not present

## 2020-05-25 DIAGNOSIS — F411 Generalized anxiety disorder: Secondary | ICD-10-CM | POA: Diagnosis not present

## 2020-06-01 DIAGNOSIS — F3181 Bipolar II disorder: Secondary | ICD-10-CM | POA: Diagnosis not present

## 2020-06-15 DIAGNOSIS — F3181 Bipolar II disorder: Secondary | ICD-10-CM | POA: Diagnosis not present

## 2020-07-03 DIAGNOSIS — F3181 Bipolar II disorder: Secondary | ICD-10-CM | POA: Diagnosis not present

## 2020-09-26 ENCOUNTER — Encounter: Payer: Self-pay | Admitting: Family Medicine

## 2021-01-24 ENCOUNTER — Encounter: Payer: Self-pay | Admitting: *Deleted
